# Patient Record
Sex: Female | Born: 1950 | Race: Black or African American | Hispanic: No | Marital: Single | Smoking: Never smoker
Health system: Southern US, Community
[De-identification: ages and names within clinical notes are randomized; demographics above are authoritative.]

## PROBLEM LIST (undated history)

## (undated) DIAGNOSIS — R569 Unspecified convulsions: Secondary | ICD-10-CM

## (undated) DIAGNOSIS — I10 Essential (primary) hypertension: Secondary | ICD-10-CM

## (undated) DIAGNOSIS — E119 Type 2 diabetes mellitus without complications: Secondary | ICD-10-CM

## (undated) DIAGNOSIS — G4730 Sleep apnea, unspecified: Secondary | ICD-10-CM

## (undated) DIAGNOSIS — N189 Chronic kidney disease, unspecified: Secondary | ICD-10-CM

## (undated) DIAGNOSIS — J984 Other disorders of lung: Secondary | ICD-10-CM

## (undated) HISTORY — PX: CARPAL TUNNEL RELEASE: SHX101

## (undated) HISTORY — PX: CHOLECYSTECTOMY: SHX55

## (undated) HISTORY — PX: DILATION AND CURETTAGE OF UTERUS: SHX78

## (undated) HISTORY — PX: TUBAL LIGATION: SHX77

## (undated) HISTORY — PX: SHOULDER SURGERY: SHX246

## (undated) HISTORY — DX: Type 2 diabetes mellitus without complications: E11.9

## (undated) HISTORY — DX: Essential (primary) hypertension: I10

## (undated) HISTORY — DX: Unspecified convulsions: R56.9

## (undated) HISTORY — DX: Other disorders of lung: J98.4

## (undated) HISTORY — PX: KNEE ARTHROSCOPY: SUR90

## (undated) HISTORY — PX: REVISION, CARPAL TUNNEL: SHX9640

## (undated) HISTORY — DX: Chronic kidney disease, unspecified: N18.9

## (undated) HISTORY — DX: Sleep apnea, unspecified: G47.30

---

## 2014-03-01 ENCOUNTER — Emergency Department (HOSPITAL_BASED_OUTPATIENT_CLINIC_OR_DEPARTMENT_OTHER): Payer: PRIVATE HEALTH INSURANCE

## 2014-03-01 ENCOUNTER — Inpatient Hospital Stay (HOSPITAL_BASED_OUTPATIENT_CLINIC_OR_DEPARTMENT_OTHER)
Admission: EM | Admit: 2014-03-01 | Discharge: 2014-03-03 | DRG: 101 | Disposition: A | Discharge status: Home / Self Care | Payer: PRIVATE HEALTH INSURANCE | Attending: Internal Medicine | Admitting: Internal Medicine

## 2014-03-01 ENCOUNTER — Encounter (HOSPITAL_BASED_OUTPATIENT_CLINIC_OR_DEPARTMENT_OTHER): Payer: Self-pay | Admitting: Emergency Medicine

## 2014-03-01 DIAGNOSIS — E119 Type 2 diabetes mellitus without complications: Secondary | ICD-10-CM

## 2014-03-01 DIAGNOSIS — Z794 Long term (current) use of insulin: Secondary | ICD-10-CM

## 2014-03-01 DIAGNOSIS — T426X5A Adverse effect of other antiepileptic and sedative-hypnotic drugs, initial encounter: Secondary | ICD-10-CM

## 2014-03-01 DIAGNOSIS — I10 Essential (primary) hypertension: Secondary | ICD-10-CM | POA: Diagnosis present

## 2014-03-01 DIAGNOSIS — R569 Unspecified convulsions: Secondary | ICD-10-CM

## 2014-03-01 DIAGNOSIS — T4275XA Adverse effect of unspecified antiepileptic and sedative-hypnotic drugs, initial encounter: Secondary | ICD-10-CM | POA: Diagnosis present

## 2014-03-01 DIAGNOSIS — G40109 Localization-related (focal) (partial) symptomatic epilepsy and epileptic syndromes with simple partial seizures, not intractable, without status epilepticus: Principal | ICD-10-CM | POA: Diagnosis present

## 2014-03-01 DIAGNOSIS — N390 Urinary tract infection, site not specified: Secondary | ICD-10-CM

## 2014-03-01 DIAGNOSIS — R9431 Abnormal electrocardiogram [ECG] [EKG]: Secondary | ICD-10-CM

## 2014-03-01 DIAGNOSIS — R931 Abnormal findings on diagnostic imaging of heart and coronary circulation: Secondary | ICD-10-CM

## 2014-03-01 DIAGNOSIS — I428 Other cardiomyopathies: Secondary | ICD-10-CM | POA: Diagnosis present

## 2014-03-01 HISTORY — DX: Type 2 diabetes mellitus without complications: E11.9

## 2014-03-01 HISTORY — DX: Essential (primary) hypertension: I10

## 2014-03-01 HISTORY — DX: Unspecified convulsions: R56.9

## 2014-03-01 LAB — COMPREHENSIVE METABOLIC PANEL
ALT: 19 U/L (ref 0–35)
ANION GAP: 16 — AB (ref 5–15)
AST: 16 U/L (ref 0–37)
Albumin: 3.4 g/dL — ABNORMAL LOW (ref 3.5–5.2)
Alkaline Phosphatase: 82 U/L (ref 39–117)
BUN: 16 mg/dL (ref 6–23)
CO2: 24 mEq/L (ref 19–32)
Calcium: 10.2 mg/dL (ref 8.4–10.5)
Chloride: 98 mEq/L (ref 96–112)
Creatinine, Ser: 1.1 mg/dL (ref 0.50–1.10)
GFR calc non Af Amer: 53 mL/min — ABNORMAL LOW (ref >90)
GFR, EST AFRICAN AMERICAN: 62 mL/min — AB (ref >90)
GLUCOSE: 273 mg/dL — AB (ref 70–99)
Potassium: 4.6 mEq/L (ref 3.7–5.3)
SODIUM: 138 meq/L (ref 137–147)
TOTAL PROTEIN: 8.1 g/dL (ref 6.0–8.3)
Total Bilirubin: <0.2 mg/dL — ABNORMAL LOW (ref 0.3–1.2)

## 2014-03-01 LAB — URINALYSIS, ROUTINE W REFLEX MICROSCOPIC
Bilirubin Urine: NEGATIVE
Glucose, UA: >1000 mg/dL — AB
HGB URINE DIPSTICK: NEGATIVE
Ketones, ur: 15 mg/dL — AB
NITRITE: NEGATIVE
Protein, ur: NEGATIVE mg/dL
SPECIFIC GRAVITY, URINE: 1.035 — AB (ref 1.005–1.030)
UROBILINOGEN UA: 0.2 mg/dL (ref 0.0–1.0)
pH: 6 (ref 5.0–8.0)

## 2014-03-01 LAB — URINE MICROSCOPIC-ADD ON

## 2014-03-01 LAB — CBC WITH DIFFERENTIAL/PLATELET
Basophils Absolute: 0 10*3/uL (ref 0.0–0.1)
Basophils Relative: 0 % (ref 0–1)
EOS ABS: 0.1 10*3/uL (ref 0.0–0.7)
EOS PCT: 1 % (ref 0–5)
HCT: 35.2 % — ABNORMAL LOW (ref 36.0–46.0)
Hemoglobin: 10.9 g/dL — ABNORMAL LOW (ref 12.0–15.0)
LYMPHS ABS: 4.1 10*3/uL — AB (ref 0.7–4.0)
Lymphocytes Relative: 49 % — ABNORMAL HIGH (ref 12–46)
MCH: 23.7 pg — ABNORMAL LOW (ref 26.0–34.0)
MCHC: 31 g/dL (ref 30.0–36.0)
MCV: 76.7 fL — AB (ref 78.0–100.0)
Monocytes Absolute: 0.6 10*3/uL (ref 0.1–1.0)
Monocytes Relative: 7 % (ref 3–12)
Neutro Abs: 3.6 10*3/uL (ref 1.7–7.7)
Neutrophils Relative %: 43 % (ref 43–77)
PLATELETS: 275 10*3/uL (ref 150–400)
RBC: 4.59 MIL/uL (ref 3.87–5.11)
RDW: 14.8 % (ref 11.5–15.5)
WBC: 8.4 10*3/uL (ref 4.0–10.5)

## 2014-03-01 LAB — VALPROIC ACID LEVEL: VALPROIC ACID LVL: 102.9 ug/mL — AB (ref 50.0–100.0)

## 2014-03-01 MED ORDER — DEXTROSE 5 % IV SOLN
1.0000 g | Freq: Once | INTRAVENOUS | Status: AC
  Administered 2014-03-01: 1 g via INTRAVENOUS

## 2014-03-01 MED ORDER — CEFTRIAXONE SODIUM 1 G IJ SOLR
INTRAMUSCULAR | Status: AC
  Filled 2014-03-01: qty 10

## 2014-03-01 MED ORDER — LEVETIRACETAM 500 MG/5ML IV SOLN
INTRAVENOUS | Status: AC
  Filled 2014-03-01: qty 10

## 2014-03-01 MED ORDER — SODIUM CHLORIDE 0.9 % IV SOLN
1000.0000 mg | Freq: Once | INTRAVENOUS | Status: AC
  Administered 2014-03-01: 1000 mg via INTRAVENOUS

## 2014-03-01 MED ORDER — SODIUM CHLORIDE 0.9 % IV BOLUS (SEPSIS)
1000.0000 mL | Freq: Once | INTRAVENOUS | Status: AC
  Administered 2014-03-01: 1000 mL via INTRAVENOUS

## 2014-03-01 NOTE — ED Notes (Addendum)
Pt c/o " seizures all day"  Seen at [facility] ED Friday for same

## 2014-03-01 NOTE — ED Notes (Signed)
Report to Paula RN

## 2014-03-01 NOTE — ED Provider Notes (Signed)
CSN: [id number]     Arrival date & time 03/01/14  1738 History  This chart was scribed for Sandra Canal, MD by Roxy Cedar, ED Scribe. This patient was seen in room MH05/MH05 and the patient's care was started at 6:27 PM.  Chief Complaint  Patient presents with  . Seizures   The history is provided by the patient. No language interpreter was used.    HPI Comments: Sandra Cobb is a 63 y.o. female who presents to the Emergency Department complaining of an episode of a grand mal seizure that occurred 2 nights ago and has been having moderate petit mal seizures all day today. She states that she has been experiencing feelings of "spaced out, staring into space, stuttering and her eyes going to the back of her neck" today. Patient states that she was seen at [facility] 2 days ago after episode of grand mal seizure. She states that she had recently ran out of her Depakote medication and missed doses for 2 days. She usually takes  of Depakote bi-daily. Patient states her last grand mal seizure occurred 8 years ago. She denies associated fever or vomiting. She is currently being seen by neurologist Dr. Zenda Alpers in [place].  Past Medical History  Diagnosis Date  . Seizures   . Diabetes mellitus without complication   . Hypertension    Past Surgical History  Procedure Laterality Date  . Dilation and curettage of uterus    . Cholecystectomy    . Carpal tunnel release    . Tubal ligation     No family history on file. History  Substance Use Topics  . Smoking status: Not on file  . Smokeless tobacco: Not on file  . Alcohol Use: Not on file   OB History   Grav Para Term Preterm Abortions TAB SAB Ect Mult Living                 Review of Systems  Constitutional: Negative for fever.  HENT: Positive for voice change (stuttering).   Gastrointestinal: Negative for vomiting.  Neurological: Positive for seizures.  All other systems reviewed and are  negative.   Allergies  Codeine  Home Medications   Prior to Admission medications   Medication Sig Start Date End Date Taking? Authorizing Provider  divalproex (DEPAKOTE ER) 500 MG 24 hr tablet Take 500 mg by mouth 2 (two) times daily.   Yes Historical Provider, MD  folic acid (FOLVITE) 800 MCG tablet Take 400 mcg by mouth daily.   Yes Historical Provider, MD  gabapentin (NEURONTIN) 100 MG capsule Take 100 mg by mouth at bedtime.   Yes Historical Provider, MD  insulin regular (NOVOLIN R,HUMULIN R) 100 units/mL injection Inject into the skin 2 (two) times daily before a meal.   Yes Historical Provider, MD  lisinopril (PRINIVIL,ZESTRIL) 10 MG tablet Take 10 mg by mouth daily.   Yes Historical Provider, MD  metFORMIN (GLUCOPHAGE) 500 MG tablet Take by mouth 2 (two) times daily with a meal.   Yes Historical Provider, MD   Triage Vitals: BP 149/75  Pulse 116  Temp(Src) 98.3 F (36.8 C) (Oral)  Resp 16  Ht  (1.549 m)  Wt 172 lb (78.019 kg)  BMI 32.52 kg/m2  SpO2 100%  Physical Exam  Nursing note and vitals reviewed. Constitutional: She is oriented to person, place, and time. She appears well-developed and well-nourished. No distress.  HENT:  Head: Normocephalic and atraumatic.  Mouth/Throat: Oropharynx is clear and moist.  Eyes: Conjunctivae and EOM are normal. Pupils are equal, round, and reactive to light.  Neck: Neck supple. No tracheal deviation present.  Cardiovascular: Regular rhythm and normal heart sounds.  Tachycardia present.   Pulmonary/Chest: Effort normal and breath sounds normal. No respiratory distress.  Musculoskeletal: Normal range of motion.  Neurological: She is alert and oriented to person, place, and time.  Normal finger to nose. Normal strength and sensation.  Skin: Skin is warm and dry.  Psychiatric: She has a normal mood and affect. Her behavior is normal.    ED Course  Procedures (including critical care time)  DIAGNOSTIC STUDIES: Oxygen  Saturation is 100% on RA, normal by my interpretation.    COORDINATION OF CARE: 6:35 PM- Discussed plans to order diagnostic imaging. Pt advised of plan for treatment and pt agrees.  Labs Review Labs Reviewed  CBC WITH DIFFERENTIAL - Abnormal; Notable for the following:    Hemoglobin 10.9 (*)    HCT 35.2 (*)    MCV 76.7 (*)    MCH 23.7 (*)    Lymphocytes Relative 49 (*)    Lymphs Abs 4.1 (*)    All other components within normal limits  COMPREHENSIVE METABOLIC PANEL - Abnormal; Notable for the following:    Glucose, Bld 273 (*)    Albumin 3.4 (*)    Total Bilirubin <0.2 (*)    GFR calc non Af Amer 53 (*)    GFR calc Af Amer 62 (*)    Anion gap 16 (*)    All other components within normal limits  URINALYSIS, ROUTINE W REFLEX MICROSCOPIC - Abnormal; Notable for the following:    APPearance CLOUDY (*)    Specific Gravity, Urine 1.035 (*)    Glucose, UA >1000 (*)    Ketones, ur 15 (*)    Leukocytes, UA MODERATE (*)    All other components within normal limits  VALPROIC ACID LEVEL - Abnormal; Notable for the following:    Valproic Acid Lvl 102.9 (*)    All other components within normal limits  URINE MICROSCOPIC-ADD ON - Abnormal; Notable for the following:    Squamous Epithelial / LPF FEW (*)    Bacteria, UA FEW (*)    All other components within normal limits    Imaging Review Ct Head Wo Contrast  03/01/2014   CLINICAL DATA:  SEIZURES SEIZURES  EXAM: CT HEAD WITHOUT CONTRAST  TECHNIQUE: Contiguous axial images were obtained from the base of the skull through the vertex without intravenous contrast.  COMPARISON:  None.  FINDINGS: Mild atrophy. Will There is no evidence of acute intracranial hemorrhage, brain edema, mass lesion, acute infarction, mass effect, or midline shift. Acute infarct may be inapparent on noncontrast CT. No other intra-axial abnormalities are seen, and the ventricles and sulci are within normal limits in size and symmetry. No abnormal extra-axial fluid  collections or masses are identified. No significant calvarial abnormality.  IMPRESSION: 1. Negative for bleed or other acute intracranial process.   Electronically Signed   By: Oley Balm M.D.   On: 03/01/2014 19:17     EKG Interpretation None      MDM   Final diagnoses:  None   Markasia Carrol is a 63 y.o. female hx seizure here with possible seizure, UTI. Labs showed depakote is supratherpeutic despite missing some doses. UA + UTI. I consulted neuro, Dr. Cyril Mourning, who recommend loading with keppra and observation. Given ceftriaxone for UTI. Will admit.   I personally performed the services described in this documentation,  which was scribed in my presence. The recorded information has been reviewed and is accurate.    Sandra Canal, MD 03/01/14 2124

## 2014-03-02 ENCOUNTER — Encounter (HOSPITAL_COMMUNITY): Payer: Self-pay | Admitting: Internal Medicine

## 2014-03-02 ENCOUNTER — Other Ambulatory Visit (HOSPITAL_COMMUNITY): Payer: Self-pay | Admitting: *Deleted

## 2014-03-02 ENCOUNTER — Ambulatory Visit (HOSPITAL_COMMUNITY): Payer: PRIVATE HEALTH INSURANCE

## 2014-03-02 DIAGNOSIS — G40109 Localization-related (focal) (partial) symptomatic epilepsy and epileptic syndromes with simple partial seizures, not intractable, without status epilepticus: Secondary | ICD-10-CM | POA: Diagnosis present

## 2014-03-02 DIAGNOSIS — E119 Type 2 diabetes mellitus without complications: Secondary | ICD-10-CM | POA: Diagnosis present

## 2014-03-02 DIAGNOSIS — N390 Urinary tract infection, site not specified: Secondary | ICD-10-CM | POA: Diagnosis present

## 2014-03-02 DIAGNOSIS — I519 Heart disease, unspecified: Secondary | ICD-10-CM

## 2014-03-02 DIAGNOSIS — Z794 Long term (current) use of insulin: Secondary | ICD-10-CM | POA: Diagnosis not present

## 2014-03-02 DIAGNOSIS — T426X5A Adverse effect of other antiepileptic and sedative-hypnotic drugs, initial encounter: Secondary | ICD-10-CM

## 2014-03-02 DIAGNOSIS — T4275XA Adverse effect of unspecified antiepileptic and sedative-hypnotic drugs, initial encounter: Secondary | ICD-10-CM | POA: Diagnosis present

## 2014-03-02 DIAGNOSIS — I10 Essential (primary) hypertension: Secondary | ICD-10-CM | POA: Diagnosis present

## 2014-03-02 DIAGNOSIS — I428 Other cardiomyopathies: Secondary | ICD-10-CM | POA: Diagnosis present

## 2014-03-02 DIAGNOSIS — R569 Unspecified convulsions: Secondary | ICD-10-CM

## 2014-03-02 DIAGNOSIS — R9431 Abnormal electrocardiogram [ECG] [EKG]: Secondary | ICD-10-CM | POA: Diagnosis present

## 2014-03-02 LAB — CBC WITH DIFFERENTIAL/PLATELET
BASOS ABS: 0 10*3/uL (ref 0.0–0.1)
Basophils Relative: 0 % (ref 0–1)
Eosinophils Absolute: 0.1 10*3/uL (ref 0.0–0.7)
Eosinophils Relative: 1 % (ref 0–5)
HCT: 31.2 % — ABNORMAL LOW (ref 36.0–46.0)
Hemoglobin: 9.5 g/dL — ABNORMAL LOW (ref 12.0–15.0)
Lymphocytes Relative: 51 % — ABNORMAL HIGH (ref 12–46)
Lymphs Abs: 3.9 10*3/uL (ref 0.7–4.0)
MCH: 23.8 pg — ABNORMAL LOW (ref 26.0–34.0)
MCHC: 30.4 g/dL (ref 30.0–36.0)
MCV: 78 fL (ref 78.0–100.0)
Monocytes Absolute: 0.5 10*3/uL (ref 0.1–1.0)
Monocytes Relative: 6 % (ref 3–12)
NEUTROS ABS: 3.2 10*3/uL (ref 1.7–7.7)
NEUTROS PCT: 42 % — AB (ref 43–77)
Platelets: 259 10*3/uL (ref 150–400)
RBC: 4 MIL/uL (ref 3.87–5.11)
RDW: 14.6 % (ref 11.5–15.5)
WBC: 7.7 10*3/uL (ref 4.0–10.5)

## 2014-03-02 LAB — TSH: TSH: 4.48 u[IU]/mL (ref 0.350–4.500)

## 2014-03-02 LAB — VALPROIC ACID LEVEL: Valproic Acid Lvl: 78.9 ug/mL (ref 50.0–100.0)

## 2014-03-02 LAB — COMPREHENSIVE METABOLIC PANEL
ALK PHOS: 73 U/L (ref 39–117)
ALT: 19 U/L (ref 0–35)
ANION GAP: 11 (ref 5–15)
AST: 14 U/L (ref 0–37)
Albumin: 2.7 g/dL — ABNORMAL LOW (ref 3.5–5.2)
BUN: 13 mg/dL (ref 6–23)
CHLORIDE: 101 meq/L (ref 96–112)
CO2: 26 mEq/L (ref 19–32)
CREATININE: 0.98 mg/dL (ref 0.50–1.10)
Calcium: 8.9 mg/dL (ref 8.4–10.5)
GFR calc non Af Amer: 61 mL/min — ABNORMAL LOW (ref >90)
GFR, EST AFRICAN AMERICAN: 71 mL/min — AB (ref >90)
Glucose, Bld: 245 mg/dL — ABNORMAL HIGH (ref 70–99)
POTASSIUM: 4.8 meq/L (ref 3.7–5.3)
Sodium: 138 mEq/L (ref 137–147)
Total Protein: 6.7 g/dL (ref 6.0–8.3)

## 2014-03-02 LAB — GLUCOSE, CAPILLARY
GLUCOSE-CAPILLARY: 194 mg/dL — AB (ref 70–99)
GLUCOSE-CAPILLARY: 147 mg/dL — AB (ref 70–99)
Glucose-Capillary: 187 mg/dL — ABNORMAL HIGH (ref 70–99)
Glucose-Capillary: 213 mg/dL — ABNORMAL HIGH (ref 70–99)

## 2014-03-02 LAB — TROPONIN I
Troponin I: <0.3 ng/mL (ref <0.30)
Troponin I: <0.3 ng/mL (ref <0.30)
Troponin I: <0.3 ng/mL (ref <0.30)

## 2014-03-02 LAB — RAPID URINE DRUG SCREEN, HOSP PERFORMED
Amphetamines: NOT DETECTED
Barbiturates: NOT DETECTED
Benzodiazepines: NOT DETECTED
COCAINE: NOT DETECTED
OPIATES: NOT DETECTED
Tetrahydrocannabinol: NOT DETECTED

## 2014-03-02 LAB — AMMONIA: Ammonia: 21 umol/L (ref 11–60)

## 2014-03-02 MED ORDER — INSULIN ASPART 100 UNIT/ML ~~LOC~~ SOLN
0.0000 [IU] | Freq: Three times a day (TID) | SUBCUTANEOUS | Status: DC
  Administered 2014-03-02: 1 [IU] via SUBCUTANEOUS
  Administered 2014-03-02 – 2014-03-03 (×3): 2 [IU] via SUBCUTANEOUS

## 2014-03-02 MED ORDER — FOLIC ACID 0.5 MG HALF TAB
500.0000 ug | ORAL_TABLET | Freq: Every day | ORAL | Status: DC
  Administered 2014-03-02 – 2014-03-03 (×2): 0.5 mg via ORAL
  Filled 2014-03-02 (×3): qty 1

## 2014-03-02 MED ORDER — LEVETIRACETAM 500 MG PO TABS: 500.0000 mg | ORAL_TABLET | Freq: Two times a day (BID) | ORAL | Status: DC

## 2014-03-02 MED ORDER — ONDANSETRON HCL 4 MG PO TABS: 4.0000 mg | ORAL_TABLET | Freq: Four times a day (QID) | ORAL | Status: DC | PRN

## 2014-03-02 MED ORDER — LISINOPRIL 10 MG PO TABS: 10.0000 mg | ORAL_TABLET | Freq: Every day | ORAL | Status: DC

## 2014-03-02 MED ORDER — DEXTROSE 5 % IV SOLN
1.0000 g | INTRAVENOUS | Status: DC
  Administered 2014-03-02: 1 g via INTRAVENOUS
  Filled 2014-03-02 (×2): qty 10

## 2014-03-02 MED ORDER — ASPIRIN 325 MG PO TABS
325.0000 mg | ORAL_TABLET | Freq: Every day | ORAL | Status: DC
  Administered 2014-03-02 – 2014-03-03 (×2): 325 mg via ORAL
  Filled 2014-03-02 (×3): qty 1

## 2014-03-02 MED ORDER — GABAPENTIN 100 MG PO CAPS
100.0000 mg | ORAL_CAPSULE | Freq: Every day | ORAL | Status: DC
  Administered 2014-03-02: 100 mg via ORAL
  Filled 2014-03-02: qty 1

## 2014-03-02 MED ORDER — ONDANSETRON HCL 4 MG/2ML IJ SOLN: 4.0000 mg | Freq: Four times a day (QID) | INTRAMUSCULAR | Status: DC | PRN

## 2014-03-02 MED ORDER — DIVALPROEX SODIUM ER 500 MG PO TB24
500.0000 mg | ORAL_TABLET | Freq: Two times a day (BID) | ORAL | Status: DC
  Administered 2014-03-02 – 2014-03-03 (×3): 500 mg via ORAL
  Filled 2014-03-02 (×6): qty 1

## 2014-03-02 MED ORDER — LEVETIRACETAM 500 MG PO TABS
500.0000 mg | ORAL_TABLET | Freq: Two times a day (BID) | ORAL | Status: DC
  Administered 2014-03-02 (×2): 500 mg via ORAL
  Filled 2014-03-02 (×2): qty 1

## 2014-03-02 MED ORDER — SODIUM CHLORIDE 0.9 % IV SOLN: INTRAVENOUS | Status: DC

## 2014-03-02 MED ORDER — SODIUM CHLORIDE 0.9 % IV SOLN
INTRAVENOUS | Status: AC
  Administered 2014-03-02 (×2): via INTRAVENOUS

## 2014-03-02 MED ORDER — ACETAMINOPHEN 650 MG RE SUPP: 650.0000 mg | Freq: Four times a day (QID) | RECTAL | Status: DC | PRN

## 2014-03-02 MED ORDER — ACETAMINOPHEN 325 MG PO TABS: 650.0000 mg | ORAL_TABLET | Freq: Four times a day (QID) | ORAL | Status: DC | PRN

## 2014-03-02 MED ORDER — ENOXAPARIN SODIUM 40 MG/0.4ML ~~LOC~~ SOLN
40.0000 mg | SUBCUTANEOUS | Status: DC
  Administered 2014-03-02: 40 mg via SUBCUTANEOUS
  Filled 2014-03-02: qty 0.4

## 2014-03-02 MED ORDER — LEVETIRACETAM IN NACL 1000 MG/100ML IV SOLN
1000.0000 mg | Freq: Two times a day (BID) | INTRAVENOUS | Status: DC
  Administered 2014-03-02: 1000 mg via INTRAVENOUS
  Filled 2014-03-02 (×2): qty 100

## 2014-03-02 NOTE — H&P (Signed)
[facility] History and Physical  Sandra Cobb JWJ:[id number] DOB: 01/03/1951 DOA: 03/01/2014  Referring physician: ER physician. Patient was transferred from the [facility] [place]. PCP: No PCP Per Patient patient recently relocated from [place].  Chief Complaint: Seizures.  HPI: Sandra Cobb is a 63 y.o. female with history of seizures diabetes mellitus and hypertension has had a recent grand mal seizure last Friday and had gone to [facility]. Patient had not been taking her Depakote for couple of days because she ran out of it. Patient was given IV Depakote and prescription. The following 2 days patient has been having increased staring spells typical of her Petit Mal seizures. Patient states that she gets one or 2 episodes every week but has been having at least 10 episodes every day last couple of days. In the ER patient was found to have supratherapeutic Depakote level. CT head did not show anything acute. Patient was given Keppra 1 g IV loading dose after discussing with neurologist. Patient has been admitted for further management. Patient denies any chest pain shortness of breath nausea vomiting abdominal pain or diarrhea. Has been having dysuria for last few days and urine shows features consistent with possible UTI.   Review of Systems: As presented in the history of presenting illness, rest negative.  Past Medical History  Diagnosis Date  . Seizures   . Diabetes mellitus without complication   . Hypertension    Past Surgical History  Procedure Laterality Date  . Dilation and curettage of uterus    . Cholecystectomy    . Carpal tunnel release    . Tubal ligation     Social History:  reports that she has never smoked. She does not have any smokeless tobacco history on file. She reports that she drinks alcohol. She reports that she does not use illicit drugs. Where does patient live home. Can patient participate in ADLs? Yes.  Allergies   Allergen Reactions  . Codeine     Family History:  Family History  Problem Relation Age of Onset  . Seizures Father   . Seizures Sister   . Breast cancer Maternal Aunt       Prior to Admission medications   Medication Sig Start Date End Date Taking? Authorizing Provider  divalproex (DEPAKOTE ER) 500 MG 24 hr tablet Take 500 mg by mouth 2 (two) times daily.   Yes Historical Provider, MD  folic acid (FOLVITE) 800 MCG tablet Take 400 mcg by mouth daily.   Yes Historical Provider, MD  gabapentin (NEURONTIN) 100 MG capsule Take 100 mg by mouth at bedtime.   Yes Historical Provider, MD  insulin regular (NOVOLIN R,HUMULIN R) 100 units/mL injection Inject into the skin 2 (two) times daily before a meal.   Yes Historical Provider, MD  lisinopril (PRINIVIL,ZESTRIL) 10 MG tablet Take 10 mg by mouth daily.   Yes Historical Provider, MD  metFORMIN (GLUCOPHAGE) 500 MG tablet Take by mouth 2 (two) times daily with a meal.   Yes Historical Provider, MD    Physical Exam: Filed Vitals:   03/01/14 2200 03/01/14 2230 03/01/14 2259 03/02/14 0015  BP: 120/58 123/49 115/61 127/65  Pulse: 101 104 109 106  Temp:   98.6 F (37 C) 98.4 F (36.9 C)  TempSrc:   Oral Oral  Resp: Height:     (1.549 m)  Weight:    79.425 kg (175 lb 1.6 oz)  SpO2: 99% 98% 97% 100%  General:  Well-developed and nourished.  Eyes: Anicteric no pallor.  ENT: No discharge from the ears eyes nose mouth.  Neck: No mass felt.  Cardiovascular: S1-S2 heard.  Respiratory: No rhonchi or crepitations.  Abdomen: Soft nontender bowel sounds present. No guarding or rigidity.  Skin: No rash.  Musculoskeletal: No edema.  Psychiatric: Appears normal.  Neurologic: Alert awake oriented to time place and person. Moves all extremities 5 x 5. No facial asymmetry.  Labs on Admission:  Basic Metabolic Panel:  Recent Labs Lab 03/01/14 1820  NA 138  K 4.6  CL 98  CO2 24  GLUCOSE 273*  BUN 16   CREATININE 1.10  CALCIUM 10.2   Liver Function Tests:  Recent Labs Lab 03/01/14 1820  AST 16  ALT 19  ALKPHOS 82  BILITOT <0.2*  PROT 8.1  ALBUMIN 3.4*   No results found for this basename: LIPASE, AMYLASE,  in the last 168 hours No results found for this basename: AMMONIA,  in the last 168 hours CBC:  Recent Labs Lab 03/01/14 1820  WBC 8.4  NEUTROABS 3.6  HGB 10.9*  HCT 35.2*  MCV 76.7*  PLT 275   Cardiac Enzymes: No results found for this basename: CKTOTAL, CKMB, CKMBINDEX, TROPONINI,  in the last 168 hours  BNP (last 3 results) No results found for this basename: PROBNP,  in the last 8760 hours CBG: No results found for this basename: GLUCAP,  in the last 168 hours  Radiological Exams on Admission: Ct Head Wo Contrast  03/01/2014   CLINICAL DATA:  SEIZURES SEIZURES  EXAM: CT HEAD WITHOUT CONTRAST  TECHNIQUE: Contiguous axial images were obtained from the base of the skull through the vertex without intravenous contrast.  COMPARISON:  None.  FINDINGS: Mild atrophy. Will There is no evidence of acute intracranial hemorrhage, brain edema, mass lesion, acute infarction, mass effect, or midline shift. Acute infarct may be inapparent on noncontrast CT. No other intra-axial abnormalities are seen, and the ventricles and sulci are within normal limits in size and symmetry. No abnormal extra-axial fluid collections or masses are identified. No significant calvarial abnormality.  IMPRESSION: 1. Negative for bleed or other acute intracranial process.   Electronically Signed   By: Oley Balm M.D.   On: 03/01/2014 19:17    EKG: Independently reviewed. Normal sinus rhythm with ST T changes in inferior leads and ST depression in the lateral leads. Patient has no chest pain.  Assessment/Plan Principal Problem:   Seizure Active Problems:   UTI (lower urinary tract infection)   Valproic acid causing adverse effect in therapeutic use   Diabetes mellitus type 2,  controlled   1. Seizure - have discussed with on-call neurologist Dr. Cyril Mourning. Dr. Cyril Mourning has advised patient to be continued on Keppra 500 mg twice a day and to recheck Depakote level and once levels are within therapeutic range to restart Depakote. Seizure precautions. EEG. Further recommendations per neurologist. 2. Supratherapeutic Depakote levels - see #1. Follow Depakote levels and LFTs and ammonia. 3. UTI - patient on ceftriaxone. Follow urine culture. Ceftriaxone. 4. Abnormal EKG - patient's EKG shows ST elevation in inferior leads with concave upwards. Patient also has ST depression in lateral leads. I have discussed with on-call cardiologist Dr. Jon Billings. Patient has no chest pain. At this time cardiologist has recommended to cycle troponins. I have also ordered 2-D echo. Aspirin. 5. Diabetes mellitus type 2 - hold metformin for now and place patient on sliding-scale coverage. Closely follow CBGs. 6. Hypertension - continue present  medications.    Code Status: Full code.  Family Communication: None.  Disposition Plan: Admit to inpatient.    Samiksha Pellicano N. [facility] Pager [phone].  If 7PM-7AM, please contact night-coverage [email or web address] Password [email or web address] 03/02/2014, 2:08 AM

## 2014-03-02 NOTE — Progress Notes (Signed)
[facility] PROGRESS NOTE  Sandra Cobb SWN:[id number] DOB: 1950/10/10 DOA: 03/01/2014 PCP: No PCP Per Patient  Assessment/Plan: 1-Seizure; partial with secondary generalization.  -Received Keppra IV load.  -Resume Depakote, valproic acid level decrease to 78 from 102. -started on Keppra.  -EEG pending.  -neurology following.   2-UTI:  -Continue with ceftriaxone day 2.  -follow urine culture.   3-Hypertension:  -Hold lisinopril, SBP in the 110 range.   4-Abnormal EKG; EKG shows ST elevation in inferior leads with concave upwards -First troponin negative. Chest pain free.  -Follow ECHO.  -Continue to cycle enzymes.   5-Diabetes mellitus type 2 - hold metformin while inpatient. sliding-scale coverage.   Code Status: Full Code.  Family Communication: care discussed with patient.  Disposition Plan: remain inpatient. Needs PCP.    Consultants:  neurology  Procedures:  ECHO; pending.   Antibiotics:  Ceftriaxone. 9-1  HPI/Subjective: Feeling better, no more episodes. She was having increase frequency for last few months. She just move to [place]. Does not have PCP.  Objective: Filed Vitals:   03/02/14 0600  BP: 110/59  Pulse: 99  Temp: 98.2 F (36.8 C)  Resp: 16    Intake/Output Summary (Last 24 hours) at 03/02/14 0755 Last data filed at 03/01/14 2151  Gross per 24 hour  Intake   1000 ml  Output      0 ml  Net   1000 ml   Filed Weights   03/01/14 1751 03/02/14 0015  Weight: 78.019 kg (172 lb) 79.425 kg (175 lb 1.6 oz)    Exam:   General: Alert in no distress.   Cardiovascular: S 1, S 2 RRR  Respiratory: CTA  Abdomen: BS present, soft, NT  Musculoskeletal: no edema,.  Neuro; non focal.   Data Reviewed: Basic Metabolic Panel:  Recent Labs Lab 03/01/14 1820 03/02/14 0457  NA 138 138  K 4.6 4.8  CL 98 101  CO2 24 26  GLUCOSE 273* 245*  BUN 16 13  CREATININE 1.10 0.98  CALCIUM 10.2 8.9   Liver Function Tests:  Recent  Labs Lab 03/01/14 1820 03/02/14 0457  AST 16 14  ALT 19 19  ALKPHOS 82 73  BILITOT <0.2* <0.2*  PROT 8.1 6.7  ALBUMIN 3.4* 2.7*   No results found for this basename: LIPASE, AMYLASE,  in the last 168 hours  Recent Labs Lab 03/02/14 0604  AMMONIA 21   CBC:  Recent Labs Lab 03/01/14 1820 03/02/14 0457  WBC 8.4 7.7  NEUTROABS 3.6 3.2  HGB 10.9* 9.5*  HCT 35.2* 31.2*  MCV 76.7* 78.0  PLT 275 259   Cardiac Enzymes:  Recent Labs Lab 03/02/14 0604  TROPONINI <0.30   BNP (last 3 results) No results found for this basename: PROBNP,  in the last 8760 hours CBG:  Recent Labs Lab 03/02/14 0745  GLUCAP 187*    No results found for this or any previous visit (from the past 240 hour(s)).   Studies: Ct Head Wo Contrast  03/01/2014   CLINICAL DATA:  SEIZURES SEIZURES  EXAM: CT HEAD WITHOUT CONTRAST  TECHNIQUE: Contiguous axial images were obtained from the base of the skull through the vertex without intravenous contrast.  COMPARISON:  None.  FINDINGS: Mild atrophy. Will There is no evidence of acute intracranial hemorrhage, brain edema, mass lesion, acute infarction, mass effect, or midline shift. Acute infarct may be inapparent on noncontrast CT. No other intra-axial abnormalities are seen, and the ventricles and sulci are within normal limits in size and  symmetry. No abnormal extra-axial fluid collections or masses are identified. No significant calvarial abnormality.  IMPRESSION: 1. Negative for bleed or other acute intracranial process.   Electronically Signed   By: Oley Balm M.D.   On: 03/01/2014 19:17    Scheduled Meds: . aspirin  325 mg Oral Daily  . cefTRIAXone (ROCEPHIN)  IV  1 g Intravenous Q24H  . divalproex  500 mg Oral BID  . enoxaparin (LOVENOX) injection  40 mg Subcutaneous Q24H  . folic acid  500 mcg Oral Daily  . gabapentin  100 mg Oral QHS  . insulin aspart  0-9 Units Subcutaneous TID WC  . levETIRAcetam  500 mg Oral BID  . lisinopril  10 mg Oral  Daily   Continuous Infusions: . sodium chloride 50 mL/hr at 03/02/14 0231    Principal Problem:   Seizure Active Problems:   UTI (lower urinary tract infection)   Valproic acid causing adverse effect in therapeutic use   Diabetes mellitus type 2, controlled    Time spent: 35 minutes.     [name]  [facility] Pager [phone]. If 7PM-7AM, please contact night-coverage at [email or web address], password [facility] 03/02/2014, 7:55 AM  LOS: 1 day

## 2014-03-02 NOTE — Progress Notes (Signed)
Echocardiogram 2D Echocardiogram has been performed.  Sandra Cobb 03/02/2014, 12:36 PM

## 2014-03-02 NOTE — Progress Notes (Signed)
Patient arrived to room via carelink.  Patient in stable condition upon arrival. VSS. Patient laying in bed and call light is within reach. Will continue to monitor patient closely. Monia Pouch, RN

## 2014-03-02 NOTE — Progress Notes (Signed)
CM CONSULT Talked to patient about follow up medical care; Patient from [place] to [place] , [place] 2 yrs ago and has been traveling back to [place] every 3 months to see her Neurologist Dr Zenda Alpers; Patient is agreeable to see a PCP in the [facility]; apt made at the [facility] for Sept 9, 2015 at 10 am; Pharmacy of choice is [facility] and patient stated that she does not have any problems getting her medicine; Independent of all ADL's prior to admission; Alexis Goodell [phone]

## 2014-03-02 NOTE — Progress Notes (Addendum)
Attempted but due to the patient having a glued on hair piece electrodes could not be applied. Pt expressed the desire to have the test done as an out patient. Dr Amada Jupiter made aware of the situation. EEG not done at this time.

## 2014-03-02 NOTE — Consult Note (Signed)
NEURO HOSPITALIST CONSULT NOTE    Reason for Consult: seizures  HPI:                                                                                                                                          Sandra Cobb is an 63 y.o. female with a past medical history significant for HTN, DM, and seizures since age 77 years old, transferred to [facility] due to daily " petit seizures" and patient's concern about having a GTC seizure. She tells me that her GTC seizure haven ben pretty well controlled throughout the years with Depakote (did not have grand mal seizures x 8 years until few days ago) which she takes faithfully but ran out Depakote x 2 days and had a GTC seizure this past Friday. Ever since, she has been having " petit seizures" that she describes as " staring into the space, spacing out or few seconds" and afterwards she is little bit confused but not always amnestic for the event. These " petit mal seizures" occur daily, several times a day. She said that she had had total of 6 grand mal seizures in life, none of them preceded by " a petit seizure".   She is allergic to Dilantin but never been treated with a new generation AED. Has UTI treated with antibiotics. Denies HA, vertigo, double vision, unsteadiness, focal weakness or numbness, slurred speech, language or vision impairment. No recent fever or chills. No alcohol intake, sleep deprivation, use of illicit drugs, narcotics, or benzodiazepines. VPA level supra-therapeutic 102.9. Loaded with 1 gram IV keppra in the ED.  Past Medical History  Diagnosis Date  . Seizures   . Diabetes mellitus without complication   . Hypertension     Past Surgical History  Procedure Laterality Date  . Dilation and curettage of uterus    . Cholecystectomy    . Carpal tunnel release    . Tubal ligation      No family history on file.   Social History:  has no tobacco, alcohol, and drug history on file.  Allergies   Allergen Reactions  . Codeine     MEDICATIONS:  Scheduled: . sodium chloride   Intravenous STAT     ROS:                                                                                                                                       History obtained from the patient  General ROS: negative for - chills, fatigue, fever, night sweats, weight gain or weight loss Psychological ROS: negative for - behavioral disorder, hallucinations, memory difficulties, mood swings or suicidal ideation Ophthalmic ROS: negative for - blurry vision, double vision, eye pain or loss of vision ENT ROS: negative for - epistaxis, nasal discharge, oral lesions, sore throat, tinnitus or vertigo Allergy and Immunology ROS: negative for - hives or itchy/watery eyes Hematological and Lymphatic ROS: negative for - bleeding problems, bruising or swollen lymph nodes Endocrine ROS: negative for - galactorrhea, hair pattern changes, polydipsia/polyuria or temperature intolerance Respiratory ROS: negative for - cough, hemoptysis, shortness of breath or wheezing Cardiovascular ROS: negative for - chest pain, dyspnea on exertion, edema or irregular heartbeat Gastrointestinal ROS: negative for - abdominal pain, diarrhea, hematemesis, nausea/vomiting or stool incontinence Genito-Urinary ROS: negative for - dysuria, hematuria, incontinence or urinary frequency/urgency Musculoskeletal ROS: negative for - joint swelling or muscular weakness Neurological ROS: as noted in HPI Dermatological ROS: negative for rash and skin lesion changes  Physical exam: pleasant female in no apparent distress. Blood pressure 127/65, pulse 106, temperature 98.4 F (36.9 C), temperature source Oral, resp. rate 18, height 5' 1"  (1.549 m), weight 79.425 kg (175 lb 1.6 oz), SpO2 100.00%. Head: normocephalic. Neck: supple, no bruits, no  JVD. Cardiac: no murmurs. Lungs: clear. Abdomen: soft, no tender, no mass. Extremities: no edema. Neurologic Examination:                                                                                                      General: Mental Status: Alert, oriented, thought content appropriate.  Speech fluent without evidence of aphasia.  Able to follow 3 step commands without difficulty. Cranial Nerves: II: Discs flat bilaterally; Visual fields grossly normal, pupils equal, round, reactive to light and accommodation III,IV, VI: ptosis not present, extra-ocular motions intact bilaterally V,VII: smile symmetric, facial light touch sensation normal bilaterally VIII: hearing normal bilaterally IX,X: gag reflex present XI: bilateral shoulder shrug XII: midline tongue extension without atrophy or fasciculations  Motor: Right : Upper extremity   5/5    Left:     Upper extremity   5/5  Lower extremity   5/5  Lower extremity   5/5 Tone and bulk:normal tone throughout; no atrophy noted Sensory: Pinprick and light touch intact throughout, bilaterally Deep Tendon Reflexes:  Right: Upper Extremity   Left: Upper extremity   biceps (C-5 to C-6) 2/4   biceps (C-5 to C-6) 2/4 tricep (C7) 2/4    triceps (C7) 2/4 Brachioradialis (C6) 2/4  Brachioradialis (C6) 2/4  Lower Extremity Lower Extremity  quadriceps (L-2 to L-4) 2/4   quadriceps (L-2 to L-4) 2/4 Achilles (S1) 2/4   Achilles (S1) 2/4  Plantars: Right: downgoing   Left: downgoing Cerebellar: normal finger-to-nose,  normal heel-to-shin test Gait:  No tested.  CV: pulses palpable throughout    No results found for this basename: cbc, bmp, coags, chol, tri, ldl, hga1c    Results for orders placed during the hospital encounter of 03/01/14 (from the past 48 hour(s))  CBC WITH DIFFERENTIAL     Status: Abnormal   Collection Time    03/01/14  6:20 PM      Result Value Ref Range   WBC 8.4  4.0 - 10.5 K/uL   RBC 4.59  3.87 - 5.11 MIL/uL    Hemoglobin 10.9 (*) 12.0 - 15.0 g/dL   HCT 35.2 (*) 36.0 - 46.0 %   MCV 76.7 (*) 78.0 - 100.0 fL   MCH 23.7 (*) 26.0 - 34.0 pg   MCHC 31.0  30.0 - 36.0 g/dL   RDW 14.8  11.5 - 15.5 %   Platelets 275  150 - 400 K/uL   Neutrophils Relative % 43  43 - 77 %   Neutro Abs 3.6  1.7 - 7.7 K/uL   Lymphocytes Relative 49 (*) 12 - 46 %   Lymphs Abs 4.1 (*) 0.7 - 4.0 K/uL   Monocytes Relative 7  3 - 12 %   Monocytes Absolute 0.6  0.1 - 1.0 K/uL   Eosinophils Relative 1  0 - 5 %   Eosinophils Absolute 0.1  0.0 - 0.7 K/uL   Basophils Relative 0  0 - 1 %   Basophils Absolute 0.0  0.0 - 0.1 K/uL  COMPREHENSIVE METABOLIC PANEL     Status: Abnormal   Collection Time    03/01/14  6:20 PM      Result Value Ref Range   Sodium 138  137 - 147 mEq/L   Potassium 4.6  3.7 - 5.3 mEq/L   Chloride 98  96 - 112 mEq/L   CO2 24  19 - 32 mEq/L   Glucose, Bld 273 (*) 70 - 99 mg/dL   BUN 16  6 - 23 mg/dL   Creatinine, Ser 1.10  0.50 - 1.10 mg/dL   Calcium 10.2  8.4 - 10.5 mg/dL   Total Protein 8.1  6.0 - 8.3 g/dL   Albumin 3.4 (*) 3.5 - 5.2 g/dL   AST 16  0 - 37 U/L   ALT 19  0 - 35 U/L   Alkaline Phosphatase 82  39 - 117 U/L   Total Bilirubin <0.2 (*) 0.3 - 1.2 mg/dL   GFR calc non Af Amer 53 (*) >90 mL/min   GFR calc Af Amer 62 (*) >90 mL/min   Comment: (NOTE)     The eGFR has been calculated using the CKD EPI equation.     This calculation has not been validated in all clinical situations.     eGFR's persistently <90 mL/min signify possible Chronic Kidney     Disease.   Anion gap 16 (*) 5 - 15  URINALYSIS, ROUTINE W REFLEX MICROSCOPIC     Status: Abnormal   Collection Time    03/01/14  6:20 PM      Result Value Ref Range   Color, Urine YELLOW  YELLOW   APPearance CLOUDY (*) CLEAR   Specific Gravity, Urine 1.035 (*) 1.005 - 1.030   pH 6.0  5.0 - 8.0   Glucose, UA >1000 (*) NEGATIVE mg/dL   Hgb urine dipstick NEGATIVE  NEGATIVE   Bilirubin Urine NEGATIVE  NEGATIVE   Ketones, ur 15 (*) NEGATIVE  mg/dL   Protein, ur NEGATIVE  NEGATIVE mg/dL   Urobilinogen, UA 0.2  0.0 - 1.0 mg/dL   Nitrite NEGATIVE  NEGATIVE   Leukocytes, UA MODERATE (*) NEGATIVE  URINE MICROSCOPIC-ADD ON     Status: Abnormal   Collection Time    03/01/14  6:20 PM      Result Value Ref Range   Squamous Epithelial / LPF FEW (*) RARE   WBC, UA 11-20  <3 WBC/hpf   RBC / HPF 3-6  <3 RBC/hpf   Bacteria, UA FEW (*) RARE   Urine-Other MUCOUS PRESENT     Comment: TRICHOMONAS PRESENT     FEW YEAST  VALPROIC ACID LEVEL     Status: Abnormal   Collection Time    03/01/14  7:12 PM      Result Value Ref Range   Valproic Acid Lvl 102.9 (*) 50.0 - 100.0 ug/mL    Ct Head Wo Contrast  03/01/2014   CLINICAL DATA:  SEIZURES SEIZURES  EXAM: CT HEAD WITHOUT CONTRAST  TECHNIQUE: Contiguous axial images were obtained from the base of the skull through the vertex without intravenous contrast.  COMPARISON:  None.  FINDINGS: Mild atrophy. Will There is no evidence of acute intracranial hemorrhage, brain edema, mass lesion, acute infarction, mass effect, or midline shift. Acute infarct may be inapparent on noncontrast CT. No other intra-axial abnormalities are seen, and the ventricles and sulci are within normal limits in size and symmetry. No abnormal extra-axial fluid collections or masses are identified. No significant calvarial abnormality.  IMPRESSION: 1. Negative for bleed or other acute intracranial process.   Electronically Signed   By: Arne Cleveland M.D.   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 Non focal neuro-exam. Supra-therapeutic VPA level. Mental status intact, but complains of daily, recurrent " petit mal seizures". Recommend: 1) Hold Depakote and check VPA level. Can resume when VPA therapeutic 2)Continue keppra 500 mg po BID for now. Will need to decide if dual AED is required.  3) EEG. 4) Will follow up.     Dorian Pod, MD 03/02/2014, 1:50 AM

## 2014-03-03 DIAGNOSIS — R9389 Abnormal findings on diagnostic imaging of other specified body structures: Secondary | ICD-10-CM

## 2014-03-03 DIAGNOSIS — R9431 Abnormal electrocardiogram [ECG] [EKG]: Secondary | ICD-10-CM

## 2014-03-03 LAB — GLUCOSE, CAPILLARY
Glucose-Capillary: 173 mg/dL — ABNORMAL HIGH (ref 70–99)
Glucose-Capillary: 153 mg/dL — ABNORMAL HIGH (ref 70–99)

## 2014-03-03 LAB — URINE CULTURE: Colony Count: 65000

## 2014-03-03 MED ORDER — ATORVASTATIN CALCIUM 20 MG PO TABS: 20.0000 mg | ORAL_TABLET | Freq: Every day | ORAL | Status: AC

## 2014-03-03 MED ORDER — METFORMIN HCL 500 MG PO TABS: 500.0000 mg | ORAL_TABLET | Freq: Two times a day (BID) | ORAL | Status: AC

## 2014-03-03 MED ORDER — ATORVASTATIN CALCIUM 10 MG PO TABS: 20.0000 mg | ORAL_TABLET | Freq: Every day | ORAL | Status: DC

## 2014-03-03 MED ORDER — GABAPENTIN 100 MG PO CAPS: 100.0000 mg | ORAL_CAPSULE | Freq: Every day | ORAL | Status: AC

## 2014-03-03 MED ORDER — DIVALPROEX SODIUM ER 500 MG PO TB24: 500.0000 mg | ORAL_TABLET | Freq: Two times a day (BID) | ORAL | Status: DC

## 2014-03-03 MED ORDER — ASPIRIN 81 MG PO CHEW
81.0000 mg | CHEWABLE_TABLET | Freq: Every day | ORAL | Status: DC
  Administered 2014-03-03: 81 mg via ORAL
  Filled 2014-03-03: qty 1

## 2014-03-03 MED ORDER — LEVETIRACETAM 1000 MG PO TABS: 1000.0000 mg | ORAL_TABLET | Freq: Two times a day (BID) | ORAL | Status: AC

## 2014-03-03 MED ORDER — DIVALPROEX SODIUM ER 500 MG PO TB24: 500.0000 mg | ORAL_TABLET | Freq: Two times a day (BID) | ORAL | Status: AC

## 2014-03-03 MED ORDER — CEPHALEXIN 500 MG PO CAPS: 500.0000 mg | ORAL_CAPSULE | Freq: Two times a day (BID) | ORAL | Status: AC

## 2014-03-03 MED ORDER — LEVETIRACETAM 500 MG PO TABS
1000.0000 mg | ORAL_TABLET | Freq: Two times a day (BID) | ORAL | Status: DC
  Administered 2014-03-03: 1000 mg via ORAL
  Filled 2014-03-03: qty 2

## 2014-03-03 MED ORDER — ASPIRIN 81 MG PO CHEW: 81.0000 mg | CHEWABLE_TABLET | Freq: Every day | ORAL | Status: AC

## 2014-03-03 MED ORDER — INSULIN REGULAR HUMAN 100 UNIT/ML IJ SOLN: 15.0000 [IU] | Freq: Two times a day (BID) | INTRAMUSCULAR | Status: AC

## 2014-03-03 NOTE — Discharge Summary (Signed)
Physician Discharge Summary  Sandra Cobb ZOX:[id number] DOB: 01/18/1951 DOA: 03/01/2014  PCP: No PCP Per Patient  Admit date: 03/01/2014 Discharge date: 03/03/2014  Time spent: 35 minutes  Recommendations for Outpatient Follow-up:  1. Needs EEG out patient.  2. Needs to follow up with cardiologist for stress test.  3. Further titration of insulin regimen.  4. Follow up final result of urine culture.    Discharge Diagnoses:    Seizure   UTI (lower urinary tract infection)   Valproic acid causing adverse effect in therapeutic use   Diabetes mellitus type 2, controlled   Abnormal EKG, mildly decrease EF.   Discharge Condition: stable.   Diet recommendation: Carb modified.   Filed Weights   03/01/14 1751 03/02/14 0015  Weight: 78.019 kg (172 lb) 79.425 kg (175 lb 1.6 oz)    History of present illness:  Sandra Cobb is a 63 y.o. female with history of seizures diabetes mellitus and hypertension has had a recent grand mal seizure last Friday and had gone to [facility]. Patient had not been taking her Depakote for couple of days because she ran out of it. Patient was given IV Depakote and prescription. The following 2 days patient has been having increased staring spells typical of her Petit Mal seizures. Patient states that she gets one or 2 episodes every week but has been having at least 10 episodes every day last couple of days. In the ER patient was found to have supratherapeutic Depakote level. CT head did not show anything acute. Patient was given Keppra 1 g IV loading dose after discussing with neurologist. Patient has been admitted for further management. Patient denies any chest pain shortness of breath nausea vomiting abdominal pain or diarrhea. Has been having dysuria for last few days and urine shows features consistent with possible UTI.    Hospital Course:  1-Seizure; partial with secondary generalization.  -Received Keppra IV load.  -Resume  Depakote, valproic acid level decrease to 78 from 102.  -She will be discharge on Keppra 1000 mg BID.  -Unable to perform EEG due to patient having a glued on hair. Plan to do EEG outpatient.  -no further seizure during this admission.   2-UTI:  -Continue with ceftriaxone day 3.  -follow urine culture.  -She will be discharge om Keflex for 3 more days.   3-Hypertension:  -will resume lisinopril.   4-Abnormal EKG, mild cardiomyopathy; EKG shows ST elevation in inferior leads with concave upwards  -Chest pain free.  -ECHO with mildly decrease EF 45 to 50 %  -Troponin negatives.  -Cardiology consulted for further evaluation.  -Cardiology recommend outpatient stress test.  -started on statin and aspirin. On ACE.   5-Diabetes mellitus type 2 - resume metformin and lower home dose insulin regimen.    Procedures: ECHO; Left ventricle: The cavity size was normal. Wall thickness was normal. Systolic function was mildly reduced. The estimated ejection fraction was in the range of 45% to 50%. Wall motion was normal; there were no regional wall motion abnormalities. Doppler parameters are consistent with abnormal left ventricular relaxation (grade 1 diastolic dysfunction).   Consultations:  Neurologist  Cardiologist  Discharge Exam: Filed Vitals:   03/03/14 1000  BP: 130/67  Pulse: 92  Temp: 98 F (36.7 C)  Resp: 18    General: No acute distress.  Cardiovascular: S 1, S 2 RRR Respiratory: CTA  Discharge Instructions You were cared for by a hospitalist during your hospital stay. If you have any questions  about your discharge medications or the care you received while you were in the hospital after you are discharged, you can call the unit and asked to speak with the hospitalist on call if the hospitalist that took care of you is not available. Once you are discharged, your primary care physician will handle any further medical issues. Please note that NO REFILLS for any  discharge medications will be authorized once you are discharged, as it is imperative that you return to your primary care physician (or establish a relationship with a primary care physician if you do not have one) for your aftercare needs so that they can reassess your need for medications and monitor your lab values.  Discharge Instructions   Diet Carb Modified    Complete by:  As directed      Increase activity slowly    Complete by:  As directed           Current Discharge Medication List    START taking these medications   Details  aspirin 81 MG chewable tablet Chew 1 tablet (81 mg total) by mouth daily. Qty: 30 tablet, Refills: 0    atorvastatin (LIPITOR) 20 MG tablet Take 1 tablet (20 mg total) by mouth daily at 6 PM. Qty: 30 tablet, Refills: 0    levETIRAcetam (KEPPRA) 1000 MG tablet Take 1 tablet (1,000 mg total) by mouth 2 (two) times daily. Qty: 60 tablet, Refills: 0      CONTINUE these medications which have CHANGED   Details  divalproex (DEPAKOTE ER) 500 MG 24 hr tablet Take 1 tablet (500 mg total) by mouth 2 (two) times daily. Qty: 60 tablet, Refills: 0    gabapentin (NEURONTIN) 100 MG capsule Take 1 capsule (100 mg total) by mouth at bedtime. Qty: 30 capsule, Refills: 0    insulin regular (NOVOLIN R,HUMULIN R) 100 units/mL injection Inject 0.15 mLs (15 Units total) into the skin 2 (two) times daily before a meal. Qty: 10 mL, Refills: 11    metFORMIN (GLUCOPHAGE) 500 MG tablet Take 1 tablet (500 mg total) by mouth 2 (two) times daily with a meal. Qty: 60 tablet, Refills: 0      CONTINUE these medications which have NOT CHANGED   Details  Ascorbic Acid (VITAMIN C PO) Take 1 tablet by mouth daily.    Cyanocobalamin (VITAMIN B 12 PO) Take 1 tablet by mouth daily.    folic acid (FOLVITE) 800 MCG tablet Take 400 mcg by mouth daily.    lisinopril (PRINIVIL,ZESTRIL) 10 MG tablet Take 10 mg by mouth at bedtime.     Multiple Vitamin (MULTIVITAMIN WITH MINERALS)  TABS tablet Take 1 tablet by mouth daily.    Omega-3 Fatty Acids (FISH OIL PO) Take 1 capsule by mouth daily.       Allergies  Allergen Reactions  . Codeine Rash  . Dilantin [Phenytoin Sodium Extended] Rash   Follow-up Information   Follow up with [facility] On 03/09/2014. (apt for 10 am. Please try to keep your apt or call to reschedule)    Contact information:   [address] [place] [address] [phone]        The results of significant diagnostics from this hospitalization (including imaging, microbiology, ancillary and laboratory) are listed below for reference.    Significant Diagnostic Studies: Ct Head Wo Contrast  03/01/2014   CLINICAL DATA:  SEIZURES SEIZURES  EXAM: CT HEAD WITHOUT CONTRAST  TECHNIQUE: Contiguous axial images were obtained  from the base of the skull through the vertex without intravenous contrast.  COMPARISON:  None.  FINDINGS: Mild atrophy. Will There is no evidence of acute intracranial hemorrhage, brain edema, mass lesion, acute infarction, mass effect, or midline shift. Acute infarct may be inapparent on noncontrast CT. No other intra-axial abnormalities are seen, and the ventricles and sulci are within normal limits in size and symmetry. No abnormal extra-axial fluid collections or masses are identified. No significant calvarial abnormality.  IMPRESSION: 1. Negative for bleed or other acute intracranial process.   Electronically Signed   By: Oley Balm M.D.   On: 03/01/2014 19:17    Microbiology: No results found for this or any previous visit (from the past 240 hour(s)).   Labs: Basic Metabolic Panel:  Recent Labs Lab 03/01/14 1820 03/02/14 0457  NA 138 138  K 4.6 4.8  CL 98 101  CO2 24 26  GLUCOSE 273* 245*  BUN 16 13  CREATININE 1.10 0.98  CALCIUM 10.2 8.9   Liver Function Tests:  Recent Labs Lab 03/01/14 1820 03/02/14 0457  AST 16 14  ALT 19 19  ALKPHOS 82 73  BILITOT <0.2* <0.2*   PROT 8.1 6.7  ALBUMIN 3.4* 2.7*   No results found for this basename: LIPASE, AMYLASE,  in the last 168 hours  Recent Labs Lab 03/02/14 0604  AMMONIA 21   CBC:  Recent Labs Lab 03/01/14 1820 03/02/14 0457  WBC 8.4 7.7  NEUTROABS 3.6 3.2  HGB 10.9* 9.5*  HCT 35.2* 31.2*  MCV 76.7* 78.0  PLT 275 259   Cardiac Enzymes:  Recent Labs Lab 03/02/14 0604 03/02/14 1050 03/02/14 1703  TROPONINI <0.30 <0.30 <0.30   BNP: BNP (last 3 results) No results found for this basename: PROBNP,  in the last 8760 hours CBG:  Recent Labs Lab 03/02/14 1205 03/02/14 1652 03/02/14 2112 03/03/14 0703 03/03/14 1126  GLUCAP 194* 147* 213* 173* 153*       Signed:  Regalado, Belkys A  [facility] 03/03/2014, 12:17 PM

## 2014-03-03 NOTE — Consult Note (Signed)
CARDIOLOGY CONSULT NOTE  Patient ID: Tacori Willhoite MRN: [id number] DOB/AGE: [birth year] 63 y.o.  Admit date: 03/01/2014 Primary Cardiologist: New Reason for Consultation: Abnormal ECG  HPI: 63 yo with history of HTN, type II diabetes, and a seizure disorder was admitted to control her seizures.  She had run out of her Depakote and had a grand mal seizure about a week ago.  She was seen in the hospital in [facility] and restarted on Depakote.  However, for about 2 days prior to admission yesterday, she was having about 10 of her typical petit mal seizures a day (staring episodes).  She was admitted yesterday for seizure management and is getting ongoing workup by neurology.   On admission, she was noted to have an abnormal ECG with concave up ST elevation in inferior leads and LVH with repolarization abnormality.  She had negative troponin x 3.  Echo was done.  I reviewed her echo. EF appears to be in the 50-55% range (low normal to mildly reduced) with possibly some inferior hypokinesis.    Patient has had no known cardiac problems. She had a negative stress test "years ago."  She does not smoke.  She takes ASA 81 at home but not a statin.  No exertional chest pain or pressure, no exertional dyspnea.  Exercise tolerance seems normal.  No lightheadedness or syncope.    Review of systems complete and found to be negative unless listed above in HPI  Past Medical History: 1. HTN 2. Type II diabetes 3. Hyperlipidemia: Not treated 4. Seizure disorder: Since age 8.  5. H/o cholecystectomy  Family History  Problem Relation Age of Onset  . Seizures Father   . Seizures Sister   . Breast cancer Maternal Aunt     History   Social History  . Marital Status: Single    Spouse Name: N/A    Number of Children: N/A  . Years of Education: N/A   Occupational History  . Not on file.   Social History Main Topics  . Smoking status: Never Smoker   . Smokeless tobacco: Not on file  . Alcohol  Use: Yes     Comment: wine ocasional.  . Drug Use: No  . Sexual Activity: Not on file   Other Topics Concern  . Not on file   Social History Narrative  . No narrative on file     Prescriptions prior to admission  Medication Sig Dispense Refill  . Ascorbic Acid (VITAMIN C PO) Take 1 tablet by mouth daily.      . Cyanocobalamin (VITAMIN B 12 PO) Take 1 tablet by mouth daily.      . divalproex (DEPAKOTE ER) 500 MG 24 hr tablet Take 500 mg by mouth 2 (two) times daily.      . folic acid (FOLVITE) 800 MCG tablet Take 400 mcg by mouth daily.      Marland Kitchen gabapentin (NEURONTIN) 100 MG capsule Take 100 mg by mouth at bedtime.      . insulin regular (NOVOLIN R,HUMULIN R) 100 units/mL injection Inject 40 Units into the skin 2 (two) times daily before a meal.       . lisinopril (PRINIVIL,ZESTRIL) 10 MG tablet Take 10 mg by mouth at bedtime.       . metFORMIN (GLUCOPHAGE) 500 MG tablet Take 500 mg by mouth 2 (two) times daily with a meal.       . Multiple Vitamin (MULTIVITAMIN WITH MINERALS) TABS tablet Take 1 tablet by mouth daily.      Marland Kitchen  Omega-3 Fatty Acids (FISH OIL PO) Take 1 capsule by mouth daily.        Physical exam Blood pressure 130/67, pulse 92, temperature 98 F (36.7 C), temperature source Oral, resp. rate 18, height  (1.549 m), weight 175 lb 1.6 oz (79.425 kg), SpO2 100.00%. General: NAD Neck: No JVD, no thyromegaly or thyroid nodule.  Lungs: Clear to auscultation bilaterally with normal respiratory effort. CV: Nondisplaced PMI.  Heart regular S1/S2, soft S4, no murmur.  No peripheral edema.  No carotid bruit.  Normal pedal pulses.  Abdomen: Soft, nontender, no hepatosplenomegaly, no distention.  Skin: Intact without lesions or rashes.  Neurologic: Alert and oriented x 3.  Psych: Normal affect. Extremities: No clubbing or cyanosis.  HEENT: Normal.   Labs:   Lab Results  Component Value Date   WBC 7.7 03/02/2014   HGB 9.5* 03/02/2014   HCT 31.2* 03/02/2014   MCV 78.0 03/02/2014    PLT 259 03/02/2014    Recent Labs Lab 03/02/14 0457  NA 138  K 4.8  CL 101  CO2 26  BUN 13  CREATININE 0.98  CALCIUM 8.9  PROT 6.7  BILITOT <0.2*  ALKPHOS 73  ALT 19  AST 14  GLUCOSE 245*   Lab Results  Component Value Date   TROPONINI <0.30 03/02/2014    EKG: NSR, LVH with repolarization abnormality (slight lateral ST depression), 1 mm concave up ST elevation in III and AVF  ASSESSMENT AND PLAN: 63 yo with history of HTN, type II diabetes, and a seizure disorder was admitted to control her seizures.  Cardiology asked to evaluate because of abnormal ECG and echo. Patient's echo shows low normal to mildly reduced systolic function, EF 50-55% with possible mild inferior hypokinesis. ECG shows LVH with repolarization, probably related to HTN.  There is concave up STE in the inferior leads. She has no cardiac symptoms (chest pain, exertional dyspnea, etc).    - Given the echo abnormality and her risk factors, I do think that she merits a stress test.  Will arrange ETT-Cardiolite, can be done as outpatient.  - Needs to continue ASA 81 daily, will add statin given h/o hyperlipidemia and diabetes.   Signed: Marca Ancona 03/03/2014 11:29 AM

## 2014-03-03 NOTE — Progress Notes (Signed)
[facility] PROGRESS NOTE  Sandra Cobb JIZ:[id number] DOB: Nov 09, 1950 DOA: 03/01/2014 PCP: No PCP Per Patient  Assessment/Plan: 1-Seizure; partial with secondary generalization.  -Received Keppra IV load.  -Resume Depakote, valproic acid level decrease to 78 from 102. -Change Keppra to oral.  -Unable to perform EEG due to patient having a glued on hair. Plan to do EEG outpatient.  -neurology following.   2-UTI:  -Continue with ceftriaxone day 3.  -follow urine culture.   3-Hypertension:  -Hold lisinopril, SBP in the 110 range.   4-Abnormal EKG; EKG shows ST elevation in inferior leads with concave upwards -Chest pain free.  -ECHO with mildly decrease EF 45 to 50 % -Troponin negatives.  -Cardiology consulted for further evaluation. Patient made NPO.   5-Diabetes mellitus type 2 - hold metformin while inpatient. sliding-scale coverage.   Code Status: Full Code.  Family Communication: care discussed with patient.  Disposition Plan: home depending on cardiology evaluation.    Consultants:  neurology  Procedures:  ECHO; pending.   Antibiotics:  Ceftriaxone. 9-1  HPI/Subjective: No more seizures, feeling well. Denies chest pain or dyspnea.   Objective: Filed Vitals:   03/03/14 0700  BP: 120/57  Pulse: 90  Temp: 97.9 F (36.6 C)  Resp: 18    Intake/Output Summary (Last 24 hours) at 03/03/14 0814 Last data filed at 03/02/14 1814  Gross per 24 hour  Intake    720 ml  Output      0 ml  Net    720 ml   Filed Weights   03/01/14 1751 03/02/14 0015  Weight: 78.019 kg (172 lb) 79.425 kg (175 lb 1.6 oz)    Exam:   General: Alert in no distress.   Cardiovascular: S 1, S 2 RRR  Respiratory: CTA  Abdomen: BS present, soft, NT  Musculoskeletal: no edema,.  Neuro; non focal.   Data Reviewed: Basic Metabolic Panel:  Recent Labs Lab 03/01/14 1820 03/02/14 0457  NA 138 138  K 4.6 4.8  CL 98 101  CO2 24 26  GLUCOSE 273* 245*  BUN 16 13   CREATININE 1.10 0.98  CALCIUM 10.2 8.9   Liver Function Tests:  Recent Labs Lab 03/01/14 1820 03/02/14 0457  AST 16 14  ALT 19 19  ALKPHOS 82 73  BILITOT <0.2* <0.2*  PROT 8.1 6.7  ALBUMIN 3.4* 2.7*   No results found for this basename: LIPASE, AMYLASE,  in the last 168 hours  Recent Labs Lab 03/02/14 0604  AMMONIA 21   CBC:  Recent Labs Lab 03/01/14 1820 03/02/14 0457  WBC 8.4 7.7  NEUTROABS 3.6 3.2  HGB 10.9* 9.5*  HCT 35.2* 31.2*  MCV 76.7* 78.0  PLT 275 259   Cardiac Enzymes:  Recent Labs Lab 03/02/14 0604 03/02/14 1050 03/02/14 1703  TROPONINI <0.30 <0.30 <0.30   BNP (last 3 results) No results found for this basename: PROBNP,  in the last 8760 hours CBG:  Recent Labs Lab 03/02/14 0745 03/02/14 1205 03/02/14 1652 03/02/14 2112 03/03/14 0703  GLUCAP 187* 194* 147* 213* 173*    No results found for this or any previous visit (from the past 240 hour(s)).   Studies: Ct Head Wo Contrast  03/01/2014   CLINICAL DATA:  SEIZURES SEIZURES  EXAM: CT HEAD WITHOUT CONTRAST  TECHNIQUE: Contiguous axial images were obtained from the base of the skull through the vertex without intravenous contrast.  COMPARISON:  None.  FINDINGS: Mild atrophy. Will There is no evidence of acute intracranial hemorrhage, brain edema, mass  lesion, acute infarction, mass effect, or midline shift. Acute infarct may be inapparent on noncontrast CT. No other intra-axial abnormalities are seen, and the ventricles and sulci are within normal limits in size and symmetry. No abnormal extra-axial fluid collections or masses are identified. No significant calvarial abnormality.  IMPRESSION: 1. Negative for bleed or other acute intracranial process.   Electronically Signed   By: Oley Balm M.D.   On: 03/01/2014 19:17    Scheduled Meds: . aspirin  325 mg Oral Daily  . cefTRIAXone (ROCEPHIN)  IV  1 g Intravenous Q24H  . divalproex  500 mg Oral BID  . enoxaparin (LOVENOX) injection  40  mg Subcutaneous Q24H  . folic acid  500 mcg Oral Daily  . gabapentin  100 mg Oral QHS  . insulin aspart  0-9 Units Subcutaneous TID WC  . levETIRAcetam  1,000 mg Oral BID   Continuous Infusions:    Principal Problem:   Seizure Active Problems:   UTI (lower urinary tract infection)   Valproic acid causing adverse effect in therapeutic use   Diabetes mellitus type 2, controlled    Time spent: 25 minutes.     [name]  [facility] Pager [phone]. If 7PM-7AM, please contact night-coverage at [email or web address], password [facility] 03/03/2014, 8:14 AM  LOS: 2 days

## 2014-03-09 ENCOUNTER — Emergency Department (HOSPITAL_BASED_OUTPATIENT_CLINIC_OR_DEPARTMENT_OTHER): Payer: PRIVATE HEALTH INSURANCE

## 2014-03-09 ENCOUNTER — Encounter (HOSPITAL_BASED_OUTPATIENT_CLINIC_OR_DEPARTMENT_OTHER): Payer: Self-pay | Admitting: Emergency Medicine

## 2014-03-09 ENCOUNTER — Emergency Department (HOSPITAL_BASED_OUTPATIENT_CLINIC_OR_DEPARTMENT_OTHER)
Admission: EM | Admit: 2014-03-09 | Discharge: 2014-03-09 | Disposition: A | Discharge status: Home / Self Care | Payer: PRIVATE HEALTH INSURANCE | Attending: Emergency Medicine | Admitting: Emergency Medicine

## 2014-03-09 DIAGNOSIS — Z792 Long term (current) use of antibiotics: Secondary | ICD-10-CM | POA: Diagnosis not present

## 2014-03-09 DIAGNOSIS — I10 Essential (primary) hypertension: Secondary | ICD-10-CM | POA: Diagnosis not present

## 2014-03-09 DIAGNOSIS — Z794 Long term (current) use of insulin: Secondary | ICD-10-CM | POA: Diagnosis not present

## 2014-03-09 DIAGNOSIS — S335XXA Sprain of ligaments of lumbar spine, initial encounter: Secondary | ICD-10-CM | POA: Insufficient documentation

## 2014-03-09 DIAGNOSIS — S0993XA Unspecified injury of face, initial encounter: Secondary | ICD-10-CM | POA: Insufficient documentation

## 2014-03-09 DIAGNOSIS — S139XXA Sprain of joints and ligaments of unspecified parts of neck, initial encounter: Secondary | ICD-10-CM | POA: Diagnosis not present

## 2014-03-09 DIAGNOSIS — E119 Type 2 diabetes mellitus without complications: Secondary | ICD-10-CM | POA: Insufficient documentation

## 2014-03-09 DIAGNOSIS — S199XXA Unspecified injury of neck, initial encounter: Secondary | ICD-10-CM

## 2014-03-09 DIAGNOSIS — Z79899 Other long term (current) drug therapy: Secondary | ICD-10-CM | POA: Insufficient documentation

## 2014-03-09 DIAGNOSIS — Y9389 Activity, other specified: Secondary | ICD-10-CM | POA: Insufficient documentation

## 2014-03-09 DIAGNOSIS — Y92410 Unspecified street and highway as the place of occurrence of the external cause: Secondary | ICD-10-CM | POA: Diagnosis not present

## 2014-03-09 DIAGNOSIS — G40909 Epilepsy, unspecified, not intractable, without status epilepticus: Secondary | ICD-10-CM | POA: Diagnosis not present

## 2014-03-09 DIAGNOSIS — S39012A Strain of muscle, fascia and tendon of lower back, initial encounter: Secondary | ICD-10-CM

## 2014-03-09 DIAGNOSIS — S161XXA Strain of muscle, fascia and tendon at neck level, initial encounter: Secondary | ICD-10-CM

## 2014-03-09 DIAGNOSIS — Z7982 Long term (current) use of aspirin: Secondary | ICD-10-CM | POA: Insufficient documentation

## 2014-03-09 MED ORDER — HYDROCODONE-ACETAMINOPHEN 5-325 MG PO TABS: 1.0000 | ORAL_TABLET | Freq: Four times a day (QID) | ORAL | Status: AC | PRN

## 2014-03-09 MED ORDER — CYCLOBENZAPRINE HCL 5 MG PO TABS: 5.0000 mg | ORAL_TABLET | Freq: Two times a day (BID) | ORAL | Status: AC | PRN

## 2014-03-09 NOTE — Discharge Instructions (Signed)
Cervical Sprain °A cervical sprain is when the tissues (ligaments) that hold the neck bones in place stretch or tear. °HOME CARE  °· Put ice on the injured area. °· Put ice in a plastic bag. °· Place a towel between your skin and the bag. °· Leave the ice on for 15-20 minutes, 3-4 times a day. °· You may have been given a collar to wear. This collar keeps your neck from moving while you heal. °· Do not take the collar off unless told by your doctor. °· If you have long hair, keep it outside of the collar. °· Ask your doctor before changing the position of your collar. You may need to change its position over time to make it more comfortable. °· If you are allowed to take off the collar for cleaning or bathing, follow your doctor's instructions on how to do it safely. °· Keep your collar clean by wiping it with mild soap and water. Dry it completely. If the collar has removable pads, remove them every 1-2 days to hand wash them with soap and water. Allow them to air dry. They should be dry before you wear them in the collar. °· Do not drive while wearing the collar. °· Only take medicine as told by your doctor. °· Keep all doctor visits as told. °· Keep all physical therapy visits as told. °· Adjust your work station so that you have good posture while you work. °· Avoid positions and activities that make your problems worse. °· Warm up and stretch before being active. °GET HELP IF: °· Your pain is not controlled with medicine. °· You cannot take less pain medicine over time as planned. °· Your activity level does not improve as expected. °GET HELP RIGHT AWAY IF:  °· You are bleeding. °· Your stomach is upset. °· You have an allergic reaction to your medicine. °· You develop new problems that you cannot explain. °· You lose feeling (become numb) or you cannot move any part of your body (paralysis). °· You have tingling or weakness in any part of your body. °· Your symptoms get worse. Symptoms include: °· Pain,  soreness, stiffness, puffiness (swelling), or a burning feeling in your neck. °· Pain when your neck is touched. °· Shoulder or upper back pain. °· Limited ability to move your neck. °· Headache. °· Dizziness. °· Your hands or arms feel week, lose feeling, or tingle. °· Muscle spasms. °· Difficulty swallowing or chewing. °MAKE SURE YOU:  °· Understand these instructions. °· Will watch your condition. °· Will get help right away if you are not doing well or get worse. °Document Released: 12/04/2007 Document Revised: 02/17/2013 Document Reviewed: 12/23/2012 °ExitCare® Patient Information ©2015 [employer]. This information is not intended to replace advice given to you by your health care provider. Make sure you discuss any questions you have with your health care provider. ° °Lumbosacral Strain °Lumbosacral strain is a strain of any of the parts that make up your lumbosacral vertebrae. Your lumbosacral vertebrae are the bones that make up the lower third of your backbone. Your lumbosacral vertebrae are held together by muscles and tough, fibrous tissue (ligaments).  °CAUSES  °A sudden blow to your back can cause lumbosacral strain. Also, anything that causes an excessive stretch of the muscles in the low back can cause this strain. This is typically seen when people exert themselves strenuously, fall, lift heavy objects, bend, or crouch repeatedly. °RISK FACTORS °· Physically demanding work. °· Participation in pushing   or pulling sports or sports that require a sudden twist of the back (tennis, golf, baseball). °· Weight lifting. °· Excessive lower back curvature. °· Forward-tilted pelvis. °· Weak back or abdominal muscles or both. °· Tight hamstrings. °SIGNS AND SYMPTOMS  °Lumbosacral strain may cause pain in the area of your injury or pain that moves (radiates) down your leg.  °DIAGNOSIS °Your health care provider can often diagnose lumbosacral strain through a physical exam. In some cases, you may need tests  such as X-ray exams.  °TREATMENT  °Treatment for your lower back injury depends on many factors that your clinician will have to evaluate. However, most treatment will include the use of anti-inflammatory medicines. °HOME CARE INSTRUCTIONS  °· Avoid hard physical activities (tennis, racquetball, waterskiing) if you are not in proper physical condition for it. This may aggravate or create problems. °· If you have a back problem, avoid sports requiring sudden body movements. Swimming and walking are generally safer activities. °· Maintain good posture. °· Maintain a healthy weight. °· For acute conditions, you may put ice on the injured area. °¨ Put ice in a plastic bag. °¨ Place a towel between your skin and the bag. °¨ Leave the ice on for 20 minutes, 2-3 times a day. °· When the low back starts healing, stretching and strengthening exercises may be recommended. °SEEK MEDICAL CARE IF: °· Your back pain is getting worse. °· You experience severe back pain not relieved with medicines. °SEEK IMMEDIATE MEDICAL CARE IF:  °· You have numbness, tingling, weakness, or problems with the use of your arms or legs. °· There is a change in bowel or bladder control. °· You have increasing pain in any area of the body, including your belly (abdomen). °· You notice shortness of breath, dizziness, or feel faint. °· You feel sick to your stomach (nauseous), are throwing up (vomiting), or become sweaty. °· You notice discoloration of your toes or legs, or your feet get very cold. °MAKE SURE YOU:  °· Understand these instructions. °· Will watch your condition. °· Will get help right away if you are not doing well or get worse. °Document Released: 03/27/2005 Document Revised: 06/22/2013 Document Reviewed: 02/03/2013 °ExitCare® Patient Information ©2015 [employer]. This information is not intended to replace advice given to you by your health care provider. Make sure you discuss any questions you have with your health care  provider. ° °
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
## 2014-03-09 NOTE — ED Notes (Signed)
NP at bedside.

## 2014-03-10 NOTE — ED Provider Notes (Signed)
  Medical screening examination/treatment/procedure(s) were performed by non-physician practitioner and as supervising physician I was immediately available for consultation/collaboration.   EKG Interpretation None         Gerhard Munch, MD 03/10/14 0011

## 2014-03-18 DIAGNOSIS — E785 Hyperlipidemia, unspecified: Secondary | ICD-10-CM | POA: Diagnosis present

## 2014-11-28 IMAGING — CR DG CERVICAL SPINE COMPLETE 4+V
6 series · 6 of 6 positions shown · non-contrast
Comparison: None.

CLINICAL DATA: Pain post trauma

EXAM:
CERVICAL SPINE  4+ VIEWS

[w c-spine lat]
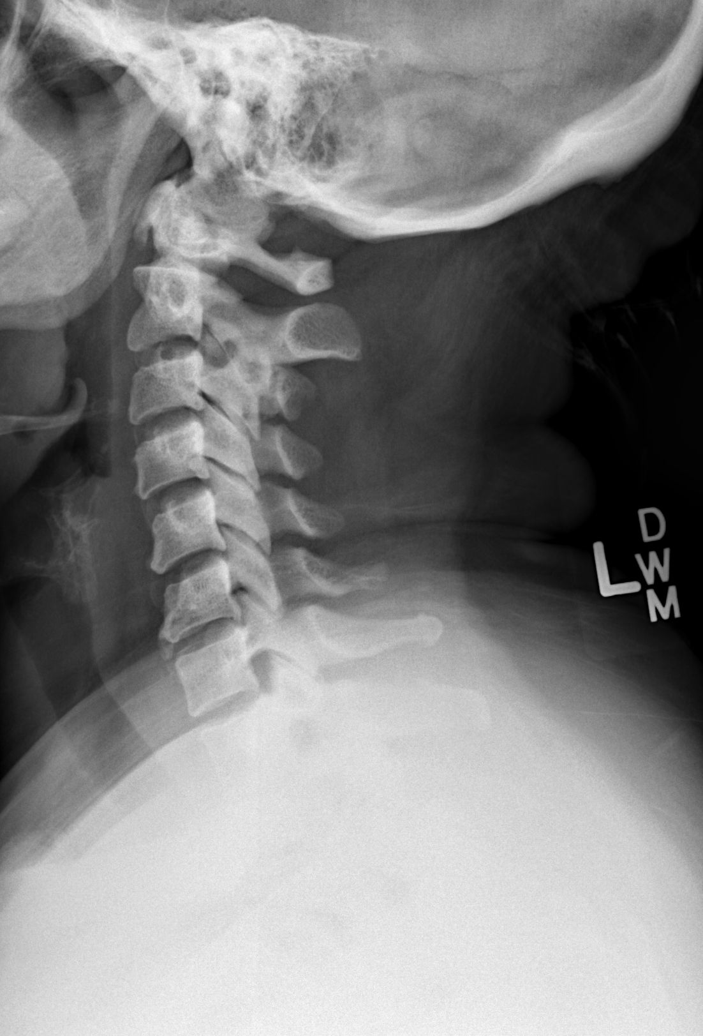

[w c-spine oblique (1 of 2)]
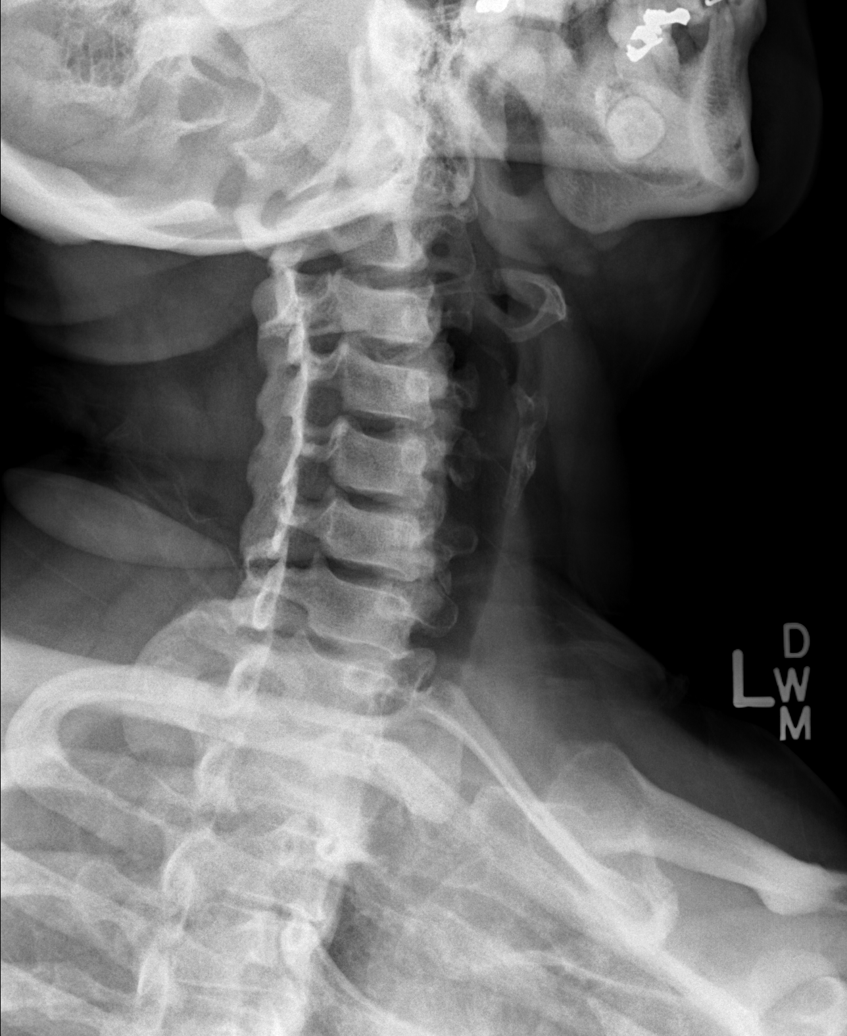

[w c-spine oblique (2 of 2)]
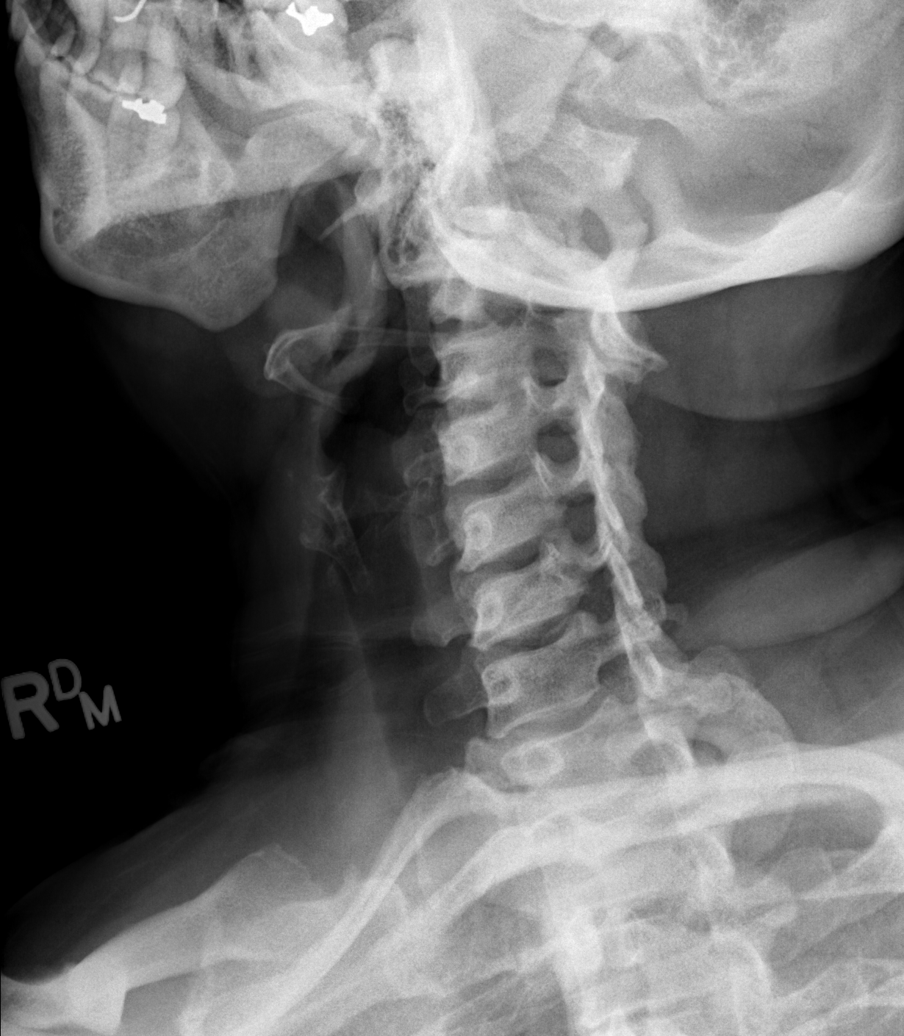

[w c-spine a.p.]
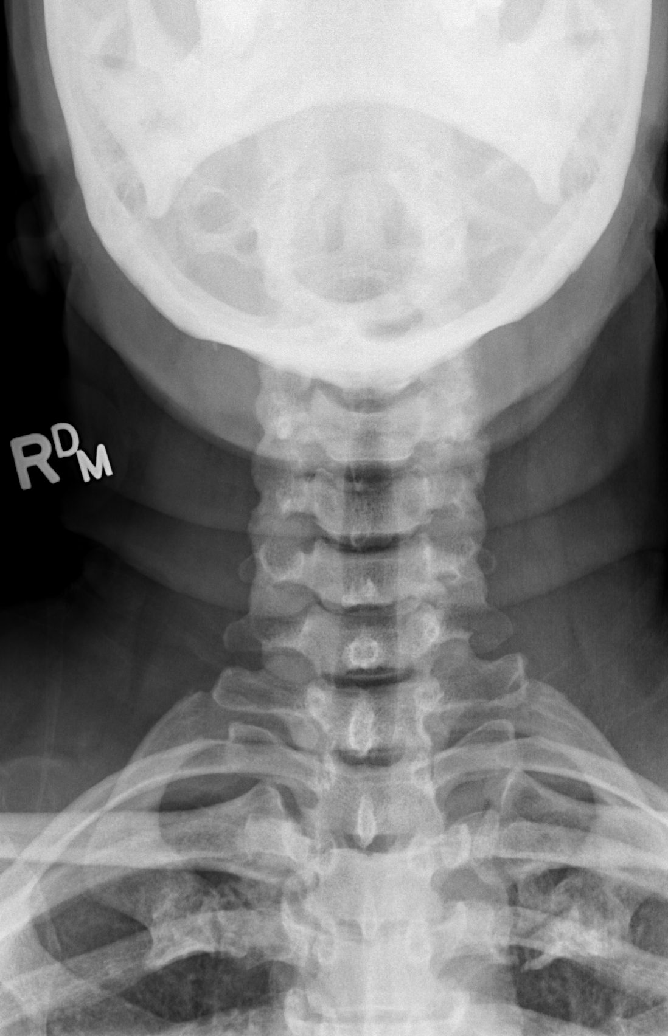

[w c-spine odontoid]
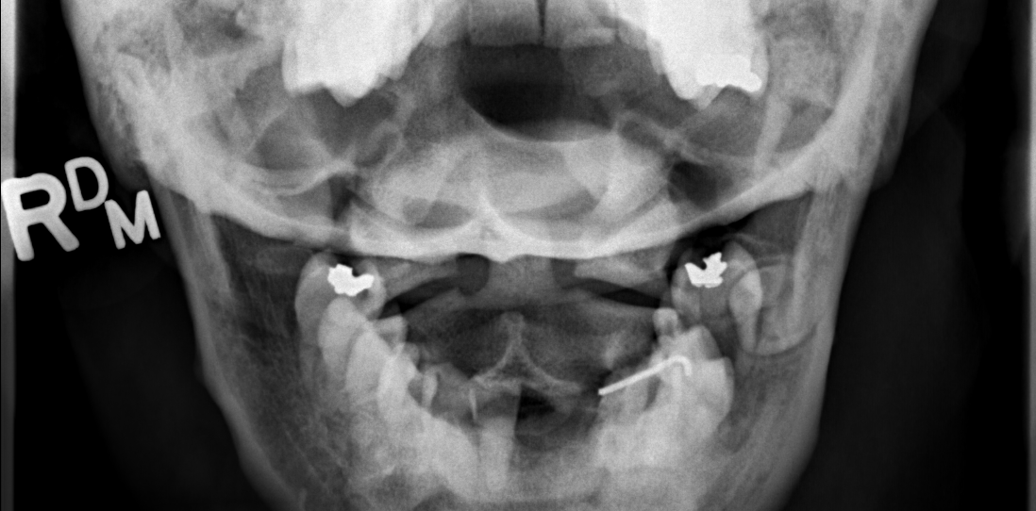

[w swimmers view]
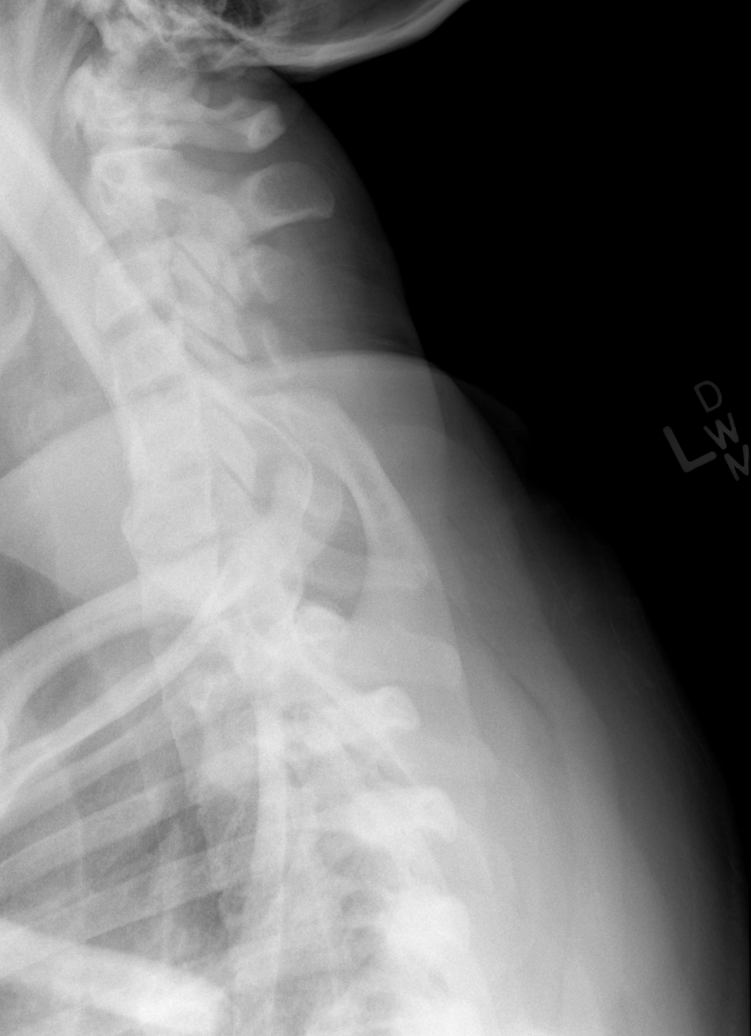

[6 of 6 positions shown; findings below may reference images not displayed]

FINDINGS: Frontal, lateral, open-mouth odontoid, and bilateral oblique views
were obtained. There is no fracture or spondylolisthesis.
Prevertebral soft tissues and predental space regions are normal.
There is slight disc space narrowing at C6-7. There are small
anterior osteophytes at C4, C5, and C6. There is no appreciable exit
foraminal narrowing on the oblique views.
IMPRESSION: Mild osteoarthritic change.  No fracture or spondylolisthesis.

## 2016-05-03 DIAGNOSIS — R0602 Shortness of breath: Secondary | ICD-10-CM

## 2018-08-26 ENCOUNTER — Other Ambulatory Visit: Payer: Self-pay | Admitting: Neurology

## 2018-08-26 DIAGNOSIS — M5412 Radiculopathy, cervical region: Secondary | ICD-10-CM

## 2018-09-08 ENCOUNTER — Other Ambulatory Visit: Payer: Self-pay | Admitting: Obstetrics & Gynecology

## 2018-09-08 DIAGNOSIS — Z1239 Encounter for other screening for malignant neoplasm of breast: Secondary | ICD-10-CM

## 2018-09-14 ENCOUNTER — Other Ambulatory Visit: Payer: Self-pay

## 2018-09-14 ENCOUNTER — Ambulatory Visit
Admission: RE | Admit: 2018-09-14 | Discharge: 2018-09-14 | Disposition: A | Discharge status: Home / Self Care | Payer: Self-pay | Source: Ambulatory Visit

## 2018-09-14 ENCOUNTER — Ambulatory Visit
Admission: RE | Admit: 2018-09-14 | Discharge: 2018-09-14 | Disposition: A | Discharge status: Home / Self Care | Payer: No Typology Code available for payment source | Source: Ambulatory Visit | Attending: Obstetrics & Gynecology | Admitting: Obstetrics & Gynecology

## 2018-09-14 ENCOUNTER — Ambulatory Visit
Admission: RE | Admit: 2018-09-14 | Discharge: 2018-09-14 | Disposition: A | Discharge status: Home / Self Care | Payer: No Typology Code available for payment source | Source: Ambulatory Visit | Attending: Neurology | Admitting: Neurology

## 2018-09-14 ENCOUNTER — Other Ambulatory Visit: Payer: Self-pay | Admitting: Obstetrics & Gynecology

## 2018-09-14 DIAGNOSIS — M5031 Other cervical disc degeneration,  high cervical region: Secondary | ICD-10-CM | POA: Insufficient documentation

## 2018-09-14 DIAGNOSIS — M5021 Other cervical disc displacement,  high cervical region: Secondary | ICD-10-CM | POA: Insufficient documentation

## 2018-09-14 DIAGNOSIS — Z1239 Encounter for other screening for malignant neoplasm of breast: Secondary | ICD-10-CM

## 2018-09-14 DIAGNOSIS — M438X2 Other specified deforming dorsopathies, cervical region: Secondary | ICD-10-CM | POA: Insufficient documentation

## 2018-09-14 DIAGNOSIS — M5412 Radiculopathy, cervical region: Secondary | ICD-10-CM | POA: Insufficient documentation

## 2018-09-14 DIAGNOSIS — M4802 Spinal stenosis, cervical region: Secondary | ICD-10-CM | POA: Insufficient documentation

## 2018-09-14 DIAGNOSIS — M47892 Other spondylosis, cervical region: Secondary | ICD-10-CM | POA: Insufficient documentation

## 2018-09-14 DIAGNOSIS — Z1231 Encounter for screening mammogram for malignant neoplasm of breast: Secondary | ICD-10-CM | POA: Insufficient documentation

## 2020-05-17 DIAGNOSIS — I5032 Chronic diastolic (congestive) heart failure: Secondary | ICD-10-CM | POA: Diagnosis present

## 2020-08-29 DIAGNOSIS — D649 Anemia, unspecified: Secondary | ICD-10-CM | POA: Diagnosis present

## 2020-08-29 DIAGNOSIS — K219 Gastro-esophageal reflux disease without esophagitis: Secondary | ICD-10-CM | POA: Diagnosis present

## 2021-01-09 DIAGNOSIS — N1831 Chronic kidney disease, stage 3a: Secondary | ICD-10-CM | POA: Diagnosis present

## 2021-01-28 DIAGNOSIS — G4733 Obstructive sleep apnea (adult) (pediatric): Secondary | ICD-10-CM | POA: Diagnosis present

## 2021-03-07 ENCOUNTER — Other Ambulatory Visit: Payer: Self-pay | Admitting: Adult Health

## 2021-03-09 ENCOUNTER — Other Ambulatory Visit: Payer: Self-pay | Admitting: Adult Health

## 2021-03-09 DIAGNOSIS — N644 Mastodynia: Secondary | ICD-10-CM

## 2021-03-23 ENCOUNTER — Other Ambulatory Visit (INDEPENDENT_AMBULATORY_CARE_PROVIDER_SITE_OTHER): Payer: Self-pay | Admitting: Adult Health

## 2021-03-28 ENCOUNTER — Telehealth: Payer: Self-pay

## 2021-03-28 NOTE — Telephone Encounter (Signed)
[facility] New Patient Coordinator Note    Physician/Location Preference:    Location Preference: first available     Physician Preference: first available     Referral:    Referring Provider: Joannie Springs NP    Is Referral required per insurance? No     History:    Personal Hx of Cancer: no     Prior Chemotherapy - no   Prior Radiation -no      Prior Surgery related to Cancer - no     Family Hx of Cancer : no     Biopsy History:    No     Imaging History:    Prior Imaging: yes     Type of Imaging: mamm, sono   Location Performed: [facility]    Other:     Are there patient owned records that will be brought to the first appointment?  No     Has the Appointment been scheduled?   Pt is scheduled with NP Osvaldo Angst on 04/26/21 at 10:30am FFX

## 2021-04-05 ENCOUNTER — Encounter: Payer: Self-pay | Admitting: Registered Nurse

## 2021-04-05 NOTE — Progress Notes (Unsigned)
[facility] Breast Surgery Consultation    HPI   CC: Breast pain, left    [name] is a 70 y.o. female patient here for complaints of left breast pain.      Recent Imaging:  03/23/2021. BDM and Bilateral breast ultrasounds- No mammographic or sonnographic evidence for malignancy. Benign ductal ectasia. BIRADS2    Family History of Breast or Ovarian Cancer:  She has no family history of breast or ovarian cancer. She has sisters, maternal aunts, and paternal aunts.    History of Breast Surgery or Breast Biopsy:  She has no history of breast surgery or breast biopsy.     The following portions of the patient's history were reviewed and updated as appropriate: allergies, current medications, past family history, past medical history, past social history, past surgical history and problem list.     has no past medical history on file.    Family History     Family History   Problem Relation Age of Onset    Breast cancer Maternal Aunt         Review of Systems   ROS:  Constitutional:  {[facility] AMB BCC Constitution:22304}  HEENT:  {[facility] AMB BCC HEENT:22305}  Cardiovascular:  {[facility] AMB BCC Cardiovascular:22306}  Respiratory:  {[facility] AMB BCC Respiratory:22307}  Gastrointestinal:  {[facility] AMB BCC Gastrointestinal:22308}  Genitourinary:  {[facility] AMB BCC Gynecologic:22316}  Musculoskeletal:  {[facility] AMB BCC Musculoskeletal:22310}  Skin:  {I[facility] AMB BCC Skin:22311}  Neurologic:  {[facility] AMB BCC Neurologic:22312}  Psychiatric:  {[facility] AMB BCC Psychiatric:22313}  Endocrine:  {[facility] AMB BCC Endocrine:22314}  Hematologic:  {[facility] AMB BCC Hematologic:22315}                     Physical Exam   There were no vitals taken for this visit.    WDWN female in NAD  HEENT:  Clear, no scleral icterus, neck supple  Neck:  No thyromegaly or masses, trachea midline  Chest:  Respiratory effort normal  CV:  No pedal edema  BJM:  Gait and station normal  Ext:  No CCE  Skin:  Free of significant ulcers or lesions  Neuro:  Grossly intact, no focal findings, alert and oriented,  asks appropriate questions  LN:  No axillary, supraclavicular or cervical adenopathy  Breast:  Symmetric . Ptosis ***   Right breast: No skin or nipple changes, no nipple retraction or discharge . No palpable masses.   Left breast : No skin or nipple changes, no nipple retraction or discharge . No palpable masses.    Rads   Radiological imaging and reports reviewed as above.     Assessment/Plan   Left breast pain    In consultation with the patient, I reviewed the finding of today's physical exam and most recent imaging. Her bilateral breasts are without discrete masses. Her most recent imaging shows no suspicious findings.     I have reviewed the breast pain handout with the patient. She should    She should follow up in 3 months. Sooner with concerns.     She agrees with the plan.

## 2021-04-06 ENCOUNTER — Ambulatory Visit: Payer: No Typology Code available for payment source | Attending: Registered Nurse | Admitting: Registered Nurse

## 2021-04-06 ENCOUNTER — Encounter: Payer: Self-pay | Admitting: Registered Nurse

## 2021-04-06 VITALS — BP 137/79 | HR 95 | Temp 98.0°F | Resp 17 | Ht 60.0 in | Wt 165.0 lb

## 2021-04-06 DIAGNOSIS — N644 Mastodynia: Secondary | ICD-10-CM | POA: Insufficient documentation

## 2021-04-06 NOTE — Patient Instructions (Signed)
Voltaren Cream prn pain

## 2021-04-26 ENCOUNTER — Ambulatory Visit: Payer: No Typology Code available for payment source | Admitting: Nurse Practitioner

## 2021-07-06 ENCOUNTER — Encounter: Payer: Self-pay | Admitting: Registered Nurse

## 2021-07-06 NOTE — Progress Notes (Unsigned)
[facility] Consultation    HPI   CC: Breast pain, bilateral    [name] is a 71 y.o. female patient here in follow up to complaints of bilateral breast pain.    She was initially seen in the office on 04/06/2021 and reported complaints of left breast pain. Pain was located around her nipple, axilla, and the entire breast. She described the pain to feel like a throbbing pain. Intermittent. She denied palpable masses, skin changes, nipple changes, and nipple discharge.     She also had complaints of occasional right breast pain around her areola. Intermittent. She denied palpable masses, skin changes, nipple changes, and nipple discharge.     She ambulates with a walker for her h/o sciatica.     She wears a bra with under wire. She drinks 3-4 cups of drinks with caffeine/day. She has recently gained weight. Approximately 7 pounds.     Recent Imaging:  03/23/2021. BDM and Bilateral breast ultrasounds- No mammographic or sonnographic evidence for malignancy. Benign ductal ectasia. BIRADS2    Family History of Breast or Ovarian Cancer:  She has no family history ovarian cancer. She has an aunt who has had breast cancer. She is unsure of the age of her diagnosis. She has 5 sisters, 3 maternal aunts, and 2 paternal aunts.    History of Breast Surgery or Breast Biopsy:  She reports no history of breast surgery or breast biopsy.  Of note, she does have a biopsy marker at the 9:00 axis of the right breast. She does not remember having the biopsy.     The following portions of the patient's history were reviewed and updated as appropriate: allergies, current medications, past family history, past medical history, past social history, past surgical history and problem list.     has a past medical history of Chronic kidney disease, Convulsions, Diabetes mellitus, Hypertension, Lung disease, and Sleep apnea.    Family History     Family History   Problem Relation Age of Onset    Breast cancer Maternal Aunt          Review of Systems   ROS:  Constitutional:  negative  HEENT:  negative  Cardiovascular:  pain in legs at rest  Respiratory:  snoring  Gastrointestinal:  negative  Genitourinary:  negative  Musculoskeletal:   back pain  Skin:  negative  Neurologic:  seizure  Psychiatric:  negative  Endocrine:  hair loss, hot flashes  Hematologic:  negative                     Physical Exam   There were no vitals taken for this visit.    WDWN female in NAD  HEENT:  Clear, no scleral icterus, neck supple  Neck:  No thyromegaly or masses, trachea midline  Chest:  Respiratory effort normal  CV:  No pedal edema  BJM:  Gait and station normal  Ext:  No CCE  Skin:  Free of significant ulcers or lesions  Neuro:  Grossly intact, no focal findings, alert and oriented, asks appropriate questions  LN:  No axillary, supraclavicular or cervical adenopathy  Breast:  Symmetric . Ptosis grade 3   Right breast: No skin or nipple changes, no nipple retraction or discharge . No palpable masses.   Left breast : No skin or nipple changes, no nipple retraction or discharge . No palpable masses.    Rads   Radiological imaging and reports reviewed as above.  Assessment/Plan   breast pain, Bilateral    In consultation with the patient, I reviewed the finding of today's physical exam and most recent imaging. Her bilateral breasts are without discrete masses. Her most recent imaging shows no suspicious findings.     I reassured the patient that breast pain is rarely a sign of breast cancer. I have reviewed the breast pain handout with the patient and provided her a copy. I recommend she have a bra fitting, wear a bra with under wire, decrease caffeine intake, start evening primrose oil, and keep a log of breast pain triggers. She may take NSAID or apply Voltaren gel to the breast PRN for pain.     She should follow up in 3 months. Sooner with worsening concerns.     She agrees with the plan.

## 2021-07-09 ENCOUNTER — Telehealth: Payer: Self-pay

## 2021-07-09 ENCOUNTER — Ambulatory Visit: Payer: No Typology Code available for payment source | Admitting: Registered Nurse

## 2021-07-09 DIAGNOSIS — N644 Mastodynia: Secondary | ICD-10-CM

## 2021-07-09 NOTE — Telephone Encounter (Signed)
left a voicemail, late for appt, requested a call back to r/s

## 2022-03-26 ENCOUNTER — Ambulatory Visit (INDEPENDENT_AMBULATORY_CARE_PROVIDER_SITE_OTHER): Payer: Medicare Other | Admitting: Neurology

## 2022-03-26 ENCOUNTER — Encounter (HOSPITAL_BASED_OUTPATIENT_CLINIC_OR_DEPARTMENT_OTHER): Payer: Self-pay | Admitting: Neurology

## 2022-03-26 VITALS — BP 138/80 | HR 75 | Temp 98.1°F | Ht 60.0 in | Wt 173.0 lb

## 2022-03-26 DIAGNOSIS — R569 Unspecified convulsions: Secondary | ICD-10-CM

## 2022-03-26 MED ORDER — CARBAMAZEPINE ER 200 MG PO TB12
200.0000 mg | ORAL_TABLET | Freq: Two times a day (BID) | ORAL | 2 refills | Status: DC
Start: 2022-03-26 — End: 2022-08-09

## 2022-03-26 NOTE — Progress Notes (Signed)
Subjective:      Patient ID: Tracey Sanchez is a 71 y.o. female.    HPI    The patient is a pleasant 71 years old right-handed African-American female who is here for establishing new neurologist for continued care of seizure disorder.  The patient reports that she has been diagnosed with.  Mall seizures since the age of 60, continues to have multiple episodes a day where she may have trouble speaking, stuttering, she may blank and stare for few seconds, these episodes may occur multiple times a day for at least 1 to 2 weeks and then she may not have any episode for at least 1 week and then may start recurring again.  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  She has been taking Keppra 1000 mg twice daily.  She has extensive work-up done including ambulatory EEG for a week at home, no report is available to review.  She was prescribed Fycompa 2 mg but because of the cost she was not able to continue.    Prior medications tried include phenobarbital, Depakote, Dilantin.    Allergies   Allergen Reactions    Dilantin [Phenytoin]     Codeine Other (See Comments) and Anxiety     Other reaction(s): Other (see comments), other/intolerance         The following portions of the patient's history were reviewed and updated as appropriate: allergies, current medications, past family history, past medical history, past social history, past surgical history, and problem list.    Past Medical History:   Diagnosis Date    Chronic kidney disease     Convulsions     Diabetes mellitus     Hypertension     Lung disease     Sleep apnea      Current Outpatient Medications on File Prior to Visit   Medication Sig Dispense Refill    amLODIPine-atorvastatin (CADUET) 2.5-10 MG per tablet Take 1 tablet by mouth daily      atorvastatin (LIPITOR) 40 MG tablet Take 1 tablet (40 mg) by mouth daily      carvedilol (COREG) 12.5 MG tablet Take 1 tablet  (12.5 mg) by mouth      ezetimibe (ZETIA) 10 MG tablet Take 1 tablet (10 mg) by mouth daily      gabapentin (NEURONTIN) 300 MG capsule Take 1 capsule (300 mg) by mouth 3 (three) times daily      Insulin Degludec (TRESIBA SC) Inject 25 Units into the skin daily      Insulin Zinc Human (NOVOLIN L SC) Inject 15 Unit into the skin PT injects 15 units every morning      Insulin Zinc Human (NOVOLIN L SC) Inject 10 Unit into the skin Pt injects 10 units every night      levETIRAcetam (KEPPRA) 1000 MG tablet Take 1 tablet (1,000 mg) by mouth 3 (three) times daily      sodium bicarbonate 650 MG tablet Take 1 tablet (650 mg) by mouth 4 (four) times daily      Insulin Glargine-Lixisenatide (SOLIQUA SC) Inject 18 Unit into the skin (Patient not taking: Reported on 03/26/2022)       No current facility-administered medications on file prior to visit.     Review of Systems   Constitutional:  Positive for activity change and fatigue.   HENT: Negative.     Eyes: Negative.  Respiratory: Negative.     Cardiovascular: Negative.    Gastrointestinal: Negative.    Endocrine: Negative.    Genitourinary: Negative.    Musculoskeletal:  Positive for arthralgias.   Skin: Negative.    Allergic/Immunologic: Negative.    Neurological:  Positive for seizures.   Hematological: Negative.    Psychiatric/Behavioral:  Positive for sleep disturbance. The patient is nervous/anxious.          Family History   Problem Relation Age of Onset    Breast cancer Maternal Aunt      Objective:     Vitals:    03/26/22 0937   BP: 138/80   BP Site: Left arm   Patient Position: Sitting   Cuff Size: Medium   Pulse: 75   Temp: 98.1 F (36.7 C)   TempSrc: Temporal   SpO2: 97%   Weight: 78.5 kg (173 lb)   Height: 1.524 m (5')     Neurologic Exam    Constitutional: Vital signs reviewed.   Head: Normocephalic, atraumatic, neck supple no JVD  Eyes: No conjunctival injection. No discharge.   ENT: Mucous membranes moist   Neck: Normal range of motion. Non tender.    Respiratory/Chest: Clear to auscultation. No respiratory distress.   Cardiovascular: Regular rate and rhythm. No murmur.   Lower Extremity: No edema. No cyanosis.   Skin: Warm and dry. No rash.   Lymphatic: No cervical lymphadenopathy.   Psychiatric: Normal affect.     Mental Status: The patient was awake, alert, and oriented. Conversation   was appropriate. Speech was fluent, and the patient followed commands   consistently.  Cranial Nerves: Pupils were equally round and reactive to light. Extraocular  movements were intact, without nystagmus. The patient's face was symmetric,   and facial sensation was intact. Tongue and palate were midline.   Motor Exam: The patient had full strength throughout. Tone and bulk appeared   normal. There were no abnormal movements or tremor noted.   Sensation: Light touch, pinprick, and joint position sense were intact.   Coordination: Finger-to-nose and heel-to-shin testing was intact   without dysmetria. Romberg testing was negative.   Gait: Normal, with normal tandem walking.   Deep Tendon Reflexes: 2+ and symmetric throughout.   Toes were downgoing.    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  She has had extensive work-up in the past including MRI brain as well as ambulatory EEG twice, no report is available to review  We will try to obtain the medical records  We will add Tegretol 200 mg once daily for 2 to 3 days and then twice daily, the side effects including rash were discussed with the patient and she expressed understanding  She will continue Keppra 1000 mg twice daily  Seizure precautions  We will repeat regular EEG, if negative, she may need EMU monitoring based on how she is  responding to Tegretol  We will continue taking her other medications for diabetes, hypertension and dyslipidemia  Seizure precautions  Follow-up in 1 month after the above work-up        Plan:       The patient will return in follow-up as outlined above. If there  are any new neurologic symptoms or worsening of current symptoms, the patient is instructed to contact us by telephone.    Jackquline Denmark, MD  Neurology, Neurophysiology  [facility]

## 2022-03-28 ENCOUNTER — Encounter (HOSPITAL_BASED_OUTPATIENT_CLINIC_OR_DEPARTMENT_OTHER): Payer: Self-pay

## 2022-04-03 ENCOUNTER — Ambulatory Visit (HOSPITAL_BASED_OUTPATIENT_CLINIC_OR_DEPARTMENT_OTHER): Payer: Medicare Other | Admitting: Neurology

## 2022-04-03 DIAGNOSIS — R569 Unspecified convulsions: Secondary | ICD-10-CM

## 2022-04-03 NOTE — Progress Notes (Signed)
This is the EEG Tech observation Report .  This report is NOT a physicians interpretation or conclusion of the study.    EEG No: [id number]  Hrs of sleep: 5 hrs  Dominance: right   Last Meal: am  Caffeine:  No  Previous EEG: Yes.  Results:  unknown    Ms. Sobecki has a current medication list which includes the following prescription(s): amlodipine-atorvastatin, atorvastatin, carbamazepine, carvedilol, ezetimibe, gabapentin, insulin degludec, insulin glargine-lixisenatide, insulin zinc human, insulin zinc human, levetiracetam, and sodium bicarbonate.     History: Continued seizure since age of 30.    Description: PS, Awake, Drowsy, Asleep, and Alert  Activity Noted:  Low voltage alpha, excessive beta.  Low voltage generalized slowing while in asleep.    No clinical observed seizure or epileptiform discharge in this recording.    Technologist: Edwyna Shell, R. EEG T.

## 2022-04-08 ENCOUNTER — Telehealth: Payer: Self-pay | Admitting: Neurology

## 2022-04-08 NOTE — Telephone Encounter (Signed)
Pt called in regards of her constant seizures she would like to see if she can get a call back to follow up and see what she can do,she stated that the medications she is currently taking is not working. She is scheduled for 10/26 but she wants to get some advice about what she should do or recommend anything, please advise.

## 2022-04-09 NOTE — Telephone Encounter (Signed)
Physician made aware, called to f/u with Pt.

## 2022-04-19 ENCOUNTER — Telehealth: Payer: Self-pay

## 2022-04-19 NOTE — Telephone Encounter (Signed)
Patient called to report possible adverse reaction to carbamazepine. Patient has been taking the medication for 2 weeks and reports dizziness. She states the dizziness occurs mainly at nighttime, she feels like the room is spinning and causes her to feel nauseous. Her symptoms began approx 1 week ago. Monday night she became dizzy and fell, no injuries reported. Patient seeking guidance regarding the medication. Will route to provider for review.

## 2022-04-19 NOTE — Telephone Encounter (Signed)
Called patient back and informed her of Dr. Urban Gibson instructions. Patient verbalized understanding, will call back with update.

## 2022-04-25 ENCOUNTER — Encounter (HOSPITAL_BASED_OUTPATIENT_CLINIC_OR_DEPARTMENT_OTHER): Payer: Self-pay | Admitting: Neurology

## 2022-04-25 ENCOUNTER — Telehealth (HOSPITAL_BASED_OUTPATIENT_CLINIC_OR_DEPARTMENT_OTHER): Payer: Medicare Other

## 2022-04-25 MED ORDER — LACOSAMIDE 100 MG PO TABS
100.0000 mg | ORAL_TABLET | Freq: Two times a day (BID) | ORAL | 2 refills | Status: DC
Start: 2022-04-25 — End: 2022-08-09

## 2022-04-25 NOTE — Progress Notes (Signed)
EEG Report    The EEG was recorded on an 18-channel digital machine according to the International 10-20 system of electrode placement. Both referential and bipolar montages were performed.    The EEG was recorded with the patient awake, drowsy and asleep. The waking background activity in the occipital lobe consists of fairly well developed medium voltage 8-9 hz. activity that attenuates with eye opening. Some eye movement artifact and low voltage fast activity is seen frontally.     Drowsiness was achieved with generalized slowing of the background rhythm.     Intermittent photic stimulation produced a bilaterally symmetric driving response. No interhemispheric asymmetries are noted.    Hyperventilation was performed with good effort, producing generalized slowing of the background rhythm.     Limited EKG rhythm strip showed a regular rhythm.     INTERPRETATION: This routine EEG recorded during wakefulness and drowsiness was within normal limits for age. There is no evidence of epileptiform activity or slowing. A normal EEG, however, does not rule out epilepsy. Clinical correlation is therefore recommended.    Azayla Polo, MD  Neurology, Neurophysiology  [facility]

## 2022-04-25 NOTE — Progress Notes (Incomplete)
Subjective:      Patient ID: Armanda HeritageSusan L Ishmael is a 71 y.o. female.    HPI    Verbal consent has been obtained from the patient to conduct a video visit encounter to minimize exposure to COVID-19.  The patient is a pleasant  71 years old female who is being seen via telemedicine for follow-up of seizure disorder.  Since the last visit, she tried Tegretol that seems to have helped improve the frequency and severity of seizures but she started having side effects including dizziness, feeling nervous, falls and diarrhea and hence the medication was discontinued.  After stopping the medication, the seizures have restarted and recurred again.  The dose of Keppra was also increased to 1500 mg twice daily that has not helped much.  Her regular EEG is normal, the option of EMU monitoring was suggested  for further evaluation and treatment.    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  She has been taking Keppra 1000 mg twice daily.  She has extensive work-up done including ambulatory EEG for a week at home, no report is available to review.  She was prescribed Fycompa 2 mg but because of the cost she was not able to continue.    Prior medications tried include phenobarbital, Depakote, Dilantin.    Allergies   Allergen Reactions   . Dilantin [Phenytoin]    . Codeine Other (See Comments) and Anxiety     Other reaction(s): Other (see comments), other/intolerance         The following portions of the patient's history were reviewed and  updated as appropriate: allergies, current medications, past family history, past medical history, past social history, past surgical history, and problem list.    Past Medical History:   Diagnosis Date   . Chronic kidney disease    . Convulsions    . Diabetes mellitus    . Hypertension    . Lung disease    . Sleep apnea      Current Outpatient Medications on File Prior to Visit   Medication Sig Dispense Refill   . amLODIPine-atorvastatin (CADUET) 2.5-10 MG per tablet Take 1 tablet by mouth daily     . atorvastatin (LIPITOR) 40 MG tablet Take 1 tablet (40 mg) by mouth daily     . carvedilol (COREG) 12.5 MG tablet Take 1 tablet (12.5 mg) by mouth     . ezetimibe (ZETIA) 10 MG tablet Take 1 tablet (10 mg) by mouth daily     . gabapentin (NEURONTIN) 300 MG capsule Take 1 capsule (300 mg) by mouth 3 (three) times daily     . Insulin Degludec (TRESIBA SC) Inject 25 Units into the skin daily     . Insulin Zinc Human (NOVOLIN L SC) Inject 15 Unit into the skin PT injects 15 units every morning     . Insulin Zinc Human (NOVOLIN L SC) Inject 10 Unit into the skin Pt injects 10  units every night     . levETIRAcetam (KEPPRA) 1000 MG tablet Take 1 tablet (1,000 mg) by mouth 3 (three) times daily     . sodium bicarbonate 650 MG tablet Take 1 tablet (650 mg) by mouth 4 (four) times daily     . carBAMazepine (TEGretol  XR) 200 MG 12 hr tablet Take 1 tablet (200 mg) by mouth 2 (two) times daily (Patient not taking: Reported on 04/25/2022) 60 tablet 2   . Insulin Glargine-Lixisenatide (SOLIQUA SC) Inject 18 Unit into the skin (Patient not taking: Reported on 03/26/2022)       No current facility-administered medications on file prior to visit.     Review of Systems   Constitutional:  Positive for activity change and fatigue.   HENT: Negative.     Eyes: Negative.    Respiratory: Negative.     Cardiovascular: Negative.    Gastrointestinal: Negative.    Endocrine: Negative.    Genitourinary: Negative.    Musculoskeletal:  Positive for  arthralgias.   Skin: Negative.    Allergic/Immunologic: Negative.    Neurological:  Positive for seizures.   Hematological: Negative.    Psychiatric/Behavioral:  Positive for sleep disturbance. The patient is nervous/anxious.          Family History   Problem Relation Age of Onset   . Breast cancer Maternal Aunt      Objective:     Vitals:    04/25/22 1003   Weight: 77.6 kg (171 lb)   Height: 1.524 m (5')     Neurologic Exam    Constitutional: Vital signs reviewed.   Head: Normocephalic, atraumatic, neck supple no JVD  Eyes: No conjunctival injection. No discharge.   ENT: Mucous membranes moist   Neck: Normal range of motion. Non tender.   Respiratory/Chest: Clear to auscultation. No respiratory distress.   Cardiovascular: Regular rate and rhythm. No murmur.   Lower Extremity: No edema. No cyanosis.   Skin: Warm and dry. No rash.   Lymphatic: No cervical lymphadenopathy.   Psychiatric: Normal affect.     Mental Status: The patient was awake, alert, and oriented. Conversation   was appropriate. Speech was fluent, and the patient followed commands   consistently.  Cranial Nerves: Pupils were equally round and reactive to light. Extraocular  movements were intact, without nystagmus. The patient's face was symmetric,   and facial sensation was intact. Tongue and palate were midline.   Motor Exam: The patient had full strength throughout. Tone and bulk appeared   normal. There were no abnormal movements or tremor noted.   Sensation: Light touch, pinprick, and joint position sense were intact.   Coordination: Finger-to-nose and heel-to-shin testing was intact   without dysmetria. Romberg testing was negative.   Gait: Normal, with normal tandem walking.   Deep Tendon Reflexes: 2+ and symmetric throughout.   Toes were downgoing.     EEG: Unremarkable, no evidence of any epileptic activity.    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  She has had extensive work-up in the past including MRI brain as well as ambulatory EEG twice, no report is available to review.    She seems to had responded  well to Tegretol with no seizures at all but was unable to continue the medication because of the concerning side effects  The option of Vimpat was recommended, she will take 100 mg once daily and taper up the dose in 1 week up to twice daily  EMU monitoring was also suggested to characterize the seizures and optimize the medication  Seizure precautions  She will continue taking her other medications for diabetes, hypertension and dyslipidemia  Seizure precautions  Follow-up in  6 weeks.        Plan:       The patient will return in follow-up as outlined above. If there are any new neurologic symptoms or worsening of current symptoms, the patient is instructed to contact us by telephone.    Lynann Bologna, MD  Neurology, Neurophysiology  [facility]

## 2022-05-03 ENCOUNTER — Encounter: Payer: Self-pay | Admitting: Nurse Practitioner

## 2022-08-09 ENCOUNTER — Ambulatory Visit
Payer: Medicare Other | Attending: Student in an Organized Health Care Education/Training Program | Admitting: Student in an Organized Health Care Education/Training Program

## 2022-08-09 ENCOUNTER — Encounter: Payer: Self-pay | Admitting: Student in an Organized Health Care Education/Training Program

## 2022-08-09 VITALS — Ht 60.0 in | Wt 172.0 lb

## 2022-08-09 DIAGNOSIS — G4733 Obstructive sleep apnea (adult) (pediatric): Secondary | ICD-10-CM

## 2022-08-09 DIAGNOSIS — G40919 Epilepsy, unspecified, intractable, without status epilepticus: Secondary | ICD-10-CM

## 2022-08-09 DIAGNOSIS — R569 Unspecified convulsions: Secondary | ICD-10-CM

## 2022-08-09 MED ORDER — XCOPRI 14 X 150 MG & 14 X200 MG PO TBPK
1.0000 | ORAL_TABLET | Freq: Every evening | ORAL | 0 refills | Status: AC
Start: 2022-08-09 — End: ?

## 2022-08-09 MED ORDER — XCOPRI 14 X 50 MG & 14 X100 MG PO TBPK
1.0000 | ORAL_TABLET | Freq: Every evening | ORAL | 0 refills | Status: DC
Start: 2022-08-09 — End: 2022-08-16

## 2022-08-09 NOTE — Progress Notes (Signed)
 [facility] NEUROLOGY  NEW OUTPATIENT ENCOUNTER       TELEMEDICINE VISIT    Patient Name: Tracey Sanchez  MRN: [id number]  Primary Care MD: Narda Rutherford, MD Phone: [phone]    Date: 09/05/2022     CC:   Chief Complaint   Patient presents with    Seizures     In reference to check seizures          HPI: Tracey Sanchez is a 72 y.o. female with PMH HTN, DM, HLD who presents today for neurologic consultation regarding seizures. She is on the visit with her daughter.    Age at seizure onset: 80  Handedness: RH    Current ASMs:   LEV 1500mg  BID - (increased from 1000mg  BID to 1500mg  BID for 2 months)  Xcopri 12.5mg  nightly   Gabapentin 300mg  TID    Prior ASMs: PHB, VPA, PHT, LEV, CBZ - bad reaction, very sick/nauseous/vomiting/dizzy, PER  *not TPM or ZNS, CLB, LCM    Started having seizures at [age]e 16. Petit mal seizures.     Up until daughters adulthood, never saw her have GTC seizure until 2019.   From 2019 until now she has had maybe 8 GTCs.   Lives by herself. If she is home she will know she had a GTC because she will have had a tongue or cheek bite.   If she is not aware, it is only when she has a GTC.    Normally she has the smaller seizures. Now it is at the point where her body starts to jerk after the petit mal ones as well.   Last week she was in the kitchen fixing something to eat and dropped the food because body was jerking.  They have been worsening over time.  She state she is aware of them.    Before when she had them, she will sutter, lose train of thought, or eyes roll in the back of her head. They last seconds but back to back. If you did not know her you may not be able to tell.    She will be talking and then she will start repeating herself.   If it progresses she is aware that her body is jerking. Tries to keep herself in the bed.     Per patient - with smaller seizures - eyes roll back into her head, jerking and suttering a lot, does not think she loses time, and then upper body can  jerk, sometimse can lose a little strength in her legs. Hands can jerk.   Sometimes 40-60 per day because they can go on all day.   Can have them in her sleep.     The ones that progress to body jerks - maybe 30 seconds or less.    Tuesday - bad one  Wednesday - had one with some jerking    Sometimes will go 1-2 weeks without having any but then they will return.    Does not miss medication dosages.    Two GTCs daguhter saw - screamed out and then went into GTC in kitchen  At [employer] and putting food in car and she had a seizure.   When she comes out of it she will know she had one.     03/26/22 - previous outpt neuro note  "The patient is a pleasant 72 years old right-handed African-American female who is here for establishing new neurologist for continued care of seizure disorder.  The patient reports  that she has been diagnosed with.  Mall seizures since the age of 33, continues to have multiple episodes a day where she may have trouble speaking, stuttering, she may blank and stare for few seconds, these episodes may occur multiple times a day for at least 1 to 2 weeks and then she may not have any episode for at least 1 week and then may start recurring again.  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  She has been taking Keppra 1000 mg twice daily.  She has extensive work-up done including ambulatory EEG for a week at home, no report is available to review.  She was prescribed Fycompa 2 mg but because of the cost she was not able to continue.  Prior medications tried include phenobarbital, Depakote, Dilantin."    Episodes of:  Waking up having bitten tongue or incontinent: yes  Myoclonic jerks: yes      Seizure Type:  GTC  Desc: sometimes starts jerking more before LOC but sometimes it just happens suddenly; eyes rolling, convulsing, tongue bite; one time with urinary incontinence and once with bowel  incontinence  Duration: 1-2 minutes  Frequency: 2 months ago     ?Seizure Type:  FA  Desc: eyes roll back into her head, jerking and suttering a lot, does not think she loses time, and then upper body can jerk, sometimes can lose a little strength in her legs. Hands can jerk.   Duration: 30 seconds  Frequency: daily, can have 40-60 in a day recently    ?Seizure Type:  ?absence?  Desc: trouble speaking, stuttering, she may blank and stare  Duration: 30 seconds  Frequency: daily      Epilepsy Risk Factors:  Birth Weight: normal  Pregnancy Complications: none  Development:  normal  History of CNS Infection: no  History of Significant Head Trauma (LOC,Amnesia): no  Febrile Convulsion: no  Family History of Epilepsy: yes - sister with seizures starting at age 70 and she is now 92 - hers were worse, she has VNS; father started having seizures at age 43        Medications:  Current Outpatient Medications on File Prior to Visit   Medication Sig Dispense Refill    amLODIPine (NORVASC) 2.5 MG tablet Take 1 tablet (2.5 mg) by mouth daily      carvedilol (COREG) 12.5 MG tablet Take 1 tablet (12.5 mg) by mouth 2 (two) times daily with meals      ezetimibe (ZETIA) 10 MG tablet Take 1 tablet (10 mg) by mouth daily      gabapentin (NEURONTIN) 300 MG capsule Take by mouth 3 (three) times daily One ( 300 mg ) tablet in the morning , one ( 300 mg ) tablet in the afternoon and two tablet (600 mg ) at bed time      Insulin Degludec (TRESIBA SC) Inject 25 Units into the skin daily      Insulin Zinc Human (NOVOLIN L SC) Inject 15 Unit into the skin PT injects 15 units every morning and 10 unit in the evening      levETIRAcetam (KEPPRA) 1000 MG tablet Take 1 tablet (1,000 mg) by mouth 3 (three) times daily      sodium bicarbonate 650 MG tablet Take 1 tablet (650 mg) by mouth 4 (four) times daily       No current facility-administered medications on file prior to visit.  Past Medical History:  Past Medical History:   Diagnosis Date     Chronic kidney disease     Convulsions     Diabetes mellitus     Hypertension     Lung disease     Sleep apnea        Past Surgical History:  Past Surgical History:   Procedure Laterality Date    CHOLECYSTECTOMY      KNEE ARTHROSCOPY Left     REVISION, CARPAL TUNNEL Left     SHOULDER SURGERY         Allergies:  Dilantin [phenytoin] and Codeine    Family History:  Family History   Problem Relation Age of Onset    Heart disease Mother     Heart disease Father     Seizures Father     Seizures Sister     Breast cancer Maternal Aunt        Social History:  Social History     Tobacco Use    Smoking status: Never    Smokeless tobacco: Never   Vaping Use    Vaping Use: Never used   Substance Use Topics    Alcohol use: Yes     Comment: glass of wine occ    Drug use: Never       Other Social History:  Housing: lives alone  Driving: no  Education Level: 9th grade  Employment: worked as bus attendant for 18 years     Review of Systems:  Constitutional: Negative for fever.   Respiratory: Negative for shortness of breath.    Cardiovascular: Negative for chest pain.   Gastrointestinal: Negative for abdominal pain.   Neurological: Negative for visual changes, alteration in awareness, speech or language problems, facial droop, focal weakness, focal numbness, limb incoordination, gait ataxia, dysphagia, or dizziness.   Psychiatric/Behavioral: Negative for mood disturbance.   All other systems reviewed and are negative except pertinent positives as noted above in HPI.     PHYSICAL EXAM:    General Exam:  Ht 1.524 m (5')   Wt 78 kg (172 lb)   BMI 33.59 kg/m   Gen:  Well-developed, well-nourished.  No acute distress.  HEENT:  Normocephalic, atraumatic.  Neck:  Normal range of motion.   Lungs:  Normal effort.  Extrem:  No edema.  Skin:  No obvious rashes or lesions.   Psych: Mood and affect are appropriate.    Neuro Exam:  MENTAL STATUS:  Awake, alert, oriented x 3.  Affect is normal. Normal attention and concentration.  Follows  commands. Speech is fluent, non dysarthric.  Memory intact. Fund of knowledge appropriate for age/education.    CRANIAL NERVES:    CN III, IV, VI - EOMI.  No nystagmus.   CN VII - Facial movements symmetric.   CN VIII - Hearing intact to conversational speech.   CN IX, X - Normal phonation.  CN XI - Shoulder shrug symmetric.   CN XII - Tongue protrusion midline  MOTOR:  No pronator drift.  Antigravity throughout.   COORDINATION: Finger to nose without dysmetria. Rapid alternating movements are smooth and fast.   GAIT:  Normal gait.        INVESTIGATIONS:    Labs:    No results found for this or any previous visit.      Imaging:     Ambulatory EEG 2023 - Occasional generalized spike and wave discharges    08/16/2022   CLINICAL DATA:  Increased seizures   EXAM:  CT HEAD WITHOUT CONTRAST     TECHNIQUE:   Contiguous axial images were obtained from the base of the skull   through the vertex without intravenous contrast.     RADIATION DOSE REDUCTION: This exam was performed according to the   departmental dose-optimization program which includes automated   exposure control, adjustment of the mA and/or kV according to   patient size and/or use of iterative reconstruction technique.     COMPARISON:  01/06/2021     FINDINGS:   Brain: No evidence of acute infarction, hemorrhage, hydrocephalus,   extra-axial collection or mass lesion/mass effect.     Vascular: No hyperdense vessel or unexpected calcification.     Skull: Normal. Negative for fracture or focal lesion.     Sinuses/Orbits: No acute finding.     IMPRESSION:   Negative head CT.         ASSESSMENT AND PLAN:  VINEY ACOCELLA is a 72 y.o. female with PMH epilepsy (onset age 33), HTN, DM, HLD who presented to me on 08/09/22 regarding seizures. She started having seizures at age 60 which she refers to as "petit mal" and then later around 2019 she began having GTCs (has had ~8 total). She continues to have smaller seizures however where she senses eye rolling, stuttering,  hard to speak, and then sometimes progressing now to some body jerks. She states that these have increased recently and are happening even up to 40-60 times per day.  LEV was increased to 1500mg  BID two months ago for these episodes but she continues to have them so Xcopri samples were started and she is currently on 12.5mg  nightly without any change. Of note, she is also on GBP 300mg  TID. She has failed many other ASMs before as well. Given some concern whether these episodes are epileptic vs nonepileptic, we will plan for EMU admission to capture the episodes and then adjust medication. She will continue Xcopri for now. We will increase her Gabapentin in the interim as well. Prior [facility] was normal. Prior ambulatory EEG showed occasional generalized spike and wave discharges.    1. Intractable epilepsy without status epilepticus, unspecified epilepsy type        2. Seizures        3. OSA on CPAP            - Continue Levetiracetam 1500mg  twice daily    - Continue Xcopri for now    - Can increase Gabapentin as tolerated (e.g. 300/300/600 and then up to 600mg  three times daily as tolerated)    - Calvert Cantor (our epilepsy monitoring unit (EMU) nurse practitioner) or Monica Becton will call you to pick a week to schedule the EMU admission  - Plan for admission from Monday-Friday      COUNSELING:  Discussed seizure precautions, including but not limited to driving, operating machinery, heights, and swimming/bathing: yes    Risks & Side Effects of Newly Prescribed Medication, Discussed with Patient: yes      Patient was called via Vidyo on Epic for today's visit due to the current COVID-19 pandemic and to limit his risk of exposure.  Patient gave consent for video visit and noted understanding that this visit would be charged similar to an in-person visit.       I have spent 60 minutes on the day of service including face to face time with patient, coordinating care, record review, and documentation.     Diannia Ruder,  MD  [facility] Neurology  Board Certified in Adult Neurology, Epilepsy, and Clinical Neurophysiology by the ABPN

## 2022-08-09 NOTE — Patient Instructions (Addendum)
-   Increase Gabapentin

## 2022-08-16 ENCOUNTER — Inpatient Hospital Stay
Admission: AD | Admit: 2022-08-16 | Discharge: 2022-08-18 | DRG: 101 | Disposition: A | Discharge status: Home / Self Care | Payer: Medicare Other | Source: Other Acute Inpatient Hospital | Attending: Internal Medicine | Admitting: Internal Medicine

## 2022-08-16 ENCOUNTER — Telehealth: Payer: Self-pay | Admitting: Student in an Organized Health Care Education/Training Program

## 2022-08-16 DIAGNOSIS — M4802 Spinal stenosis, cervical region: Secondary | ICD-10-CM | POA: Diagnosis present

## 2022-08-16 DIAGNOSIS — E1165 Type 2 diabetes mellitus with hyperglycemia: Secondary | ICD-10-CM | POA: Diagnosis present

## 2022-08-16 DIAGNOSIS — G40909 Epilepsy, unspecified, not intractable, without status epilepticus: Principal | ICD-10-CM | POA: Diagnosis present

## 2022-08-16 DIAGNOSIS — I5032 Chronic diastolic (congestive) heart failure: Secondary | ICD-10-CM | POA: Diagnosis present

## 2022-08-16 DIAGNOSIS — G4733 Obstructive sleep apnea (adult) (pediatric): Secondary | ICD-10-CM | POA: Diagnosis present

## 2022-08-16 DIAGNOSIS — E875 Hyperkalemia: Secondary | ICD-10-CM | POA: Diagnosis present

## 2022-08-16 DIAGNOSIS — R569 Unspecified convulsions: Principal | ICD-10-CM

## 2022-08-16 DIAGNOSIS — Z79899 Other long term (current) drug therapy: Secondary | ICD-10-CM

## 2022-08-16 DIAGNOSIS — K219 Gastro-esophageal reflux disease without esophagitis: Secondary | ICD-10-CM | POA: Diagnosis present

## 2022-08-16 DIAGNOSIS — E785 Hyperlipidemia, unspecified: Secondary | ICD-10-CM | POA: Diagnosis present

## 2022-08-16 DIAGNOSIS — I130 Hypertensive heart and chronic kidney disease with heart failure and stage 1 through stage 4 chronic kidney disease, or unspecified chronic kidney disease: Secondary | ICD-10-CM | POA: Diagnosis present

## 2022-08-16 DIAGNOSIS — D649 Anemia, unspecified: Secondary | ICD-10-CM | POA: Diagnosis present

## 2022-08-16 DIAGNOSIS — Z794 Long term (current) use of insulin: Secondary | ICD-10-CM

## 2022-08-16 DIAGNOSIS — D631 Anemia in chronic kidney disease: Secondary | ICD-10-CM | POA: Diagnosis present

## 2022-08-16 DIAGNOSIS — N1831 Chronic kidney disease, stage 3a: Secondary | ICD-10-CM | POA: Diagnosis present

## 2022-08-16 DIAGNOSIS — E669 Obesity, unspecified: Secondary | ICD-10-CM | POA: Diagnosis present

## 2022-08-16 DIAGNOSIS — Z6833 Body mass index (BMI) 33.0-33.9, adult: Secondary | ICD-10-CM

## 2022-08-16 DIAGNOSIS — E1122 Type 2 diabetes mellitus with diabetic chronic kidney disease: Secondary | ICD-10-CM | POA: Diagnosis present

## 2022-08-16 LAB — COMPREHENSIVE METABOLIC PANEL
ALT: 15 U/L (ref 0–55)
AST (SGOT): 14 U/L (ref 5–41)
Albumin/Globulin Ratio: 0.9 (ref 0.9–2.2)
Albumin: 3.1 g/dL — ABNORMAL LOW (ref 3.5–5.0)
Alkaline Phosphatase: 106 U/L (ref 37–117)
Anion Gap: 7 (ref 5.0–15.0)
BUN: 22 mg/dL — ABNORMAL HIGH (ref 7.0–21.0)
Bilirubin, Total: 0.2 mg/dL (ref 0.2–1.2)
CO2: 23 mEq/L (ref 17–29)
Calcium: 8.4 mg/dL — ABNORMAL LOW (ref 8.5–10.5)
Chloride: 111 mEq/L (ref 99–111)
Creatinine: 1.5 mg/dL — ABNORMAL HIGH (ref 0.4–1.0)
Globulin: 3.3 g/dL (ref 2.0–3.6)
Glucose: 259 mg/dL — ABNORMAL HIGH (ref 70–100)
Potassium: 5.4 mEq/L — ABNORMAL HIGH (ref 3.5–5.3)
Protein, Total: 6.4 g/dL (ref 6.0–8.3)
Sodium: 141 mEq/L (ref 135–145)
eGFR: 37.4 mL/min/{1.73_m2} — AB (ref >=60)

## 2022-08-16 LAB — LACTIC ACID: Lactic Acid: 1.1 mmol/L (ref 0.2–2.0)

## 2022-08-16 LAB — CBC AND DIFFERENTIAL
Absolute NRBC: 0 10*3/uL (ref 0.00–0.00)
Basophils Absolute Automated: 0.04 10*3/uL (ref 0.00–0.08)
Basophils Automated: 0.7 %
Eosinophils Absolute Automated: 0.11 10*3/uL (ref 0.00–0.44)
Eosinophils Automated: 1.8 %
Hematocrit: 32.3 % — ABNORMAL LOW (ref 34.7–43.7)
Hgb: 10 g/dL — ABNORMAL LOW (ref 11.4–14.8)
Immature Granulocytes Absolute: 0.02 10*3/uL (ref 0.00–0.07)
Immature Granulocytes: 0.3 %
Instrument Absolute Neutrophil Count: 2.7 10*3/uL (ref 1.10–6.33)
Lymphocytes Absolute Automated: 2.76 10*3/uL (ref 0.42–3.22)
Lymphocytes Automated: 45.1 %
MCH: 25.1 pg (ref 25.1–33.5)
MCHC: 31 g/dL — ABNORMAL LOW (ref 31.5–35.8)
MCV: 81 fL (ref 78.0–96.0)
MPV: 11.2 fL (ref 8.9–12.5)
Monocytes Absolute Automated: 0.49 10*3/uL (ref 0.21–0.85)
Monocytes: 8 %
Neutrophils Absolute: 2.7 10*3/uL (ref 1.10–6.33)
Neutrophils: 44.1 %
Nucleated RBC: 0 /100 WBC (ref 0.0–0.0)
Platelets: 242 10*3/uL (ref 142–346)
RBC: 3.99 10*6/uL (ref 3.90–5.10)
RDW: 14 % (ref 11–15)
WBC: 6.12 10*3/uL (ref 3.10–9.50)

## 2022-08-16 LAB — URINALYSIS WITH REFLEX TO MICROSCOPIC EXAM IF INDICATED
Bilirubin, UA: NEGATIVE
Blood, UA: NEGATIVE
Glucose, UA: NEGATIVE
Ketones UA: NEGATIVE
Leukocyte Esterase, UA: NEGATIVE
Nitrite, UA: NEGATIVE
Specific Gravity UA: 1.024 (ref 1.001–1.035)
Urine pH: 6.5 (ref 5.0–8.0)
Urobilinogen, UA: NORMAL mg/dL (ref 0.2–2.0)

## 2022-08-16 LAB — CREATINE KINASE (CK): Creatine Kinase (CK): 87 U/L (ref 29–233)

## 2022-08-16 LAB — WHOLE BLOOD GLUCOSE POCT: Whole Blood Glucose POCT: 234 mg/dL — ABNORMAL HIGH (ref 70–100)

## 2022-08-16 LAB — TSH: TSH: 1.4 u[IU]/mL (ref 0.35–4.94)

## 2022-08-16 LAB — MAGNESIUM: Magnesium: 1.7 mg/dL (ref 1.6–2.6)

## 2022-08-16 MED ORDER — BENZONATATE 100 MG PO CAPS
100.0000 mg | ORAL_CAPSULE | Freq: Three times a day (TID) | ORAL | Status: DC | PRN
Start: 2022-08-16 — End: 2022-08-18

## 2022-08-16 MED ORDER — GLUCAGON 1 MG IJ SOLR (WRAP)
1.0000 mg | INTRAMUSCULAR | Status: DC | PRN
Start: 2022-08-16 — End: 2022-08-18

## 2022-08-16 MED ORDER — CENOBAMATE 25 MG PO TABS
25.0000 mg | ORAL_TABLET | Freq: Every day | ORAL | Status: DC
Start: 2022-08-16 — End: 2022-08-17
  Administered 2022-08-17: 25 mg via ORAL

## 2022-08-16 MED ORDER — POTASSIUM CHLORIDE CRYS ER 20 MEQ PO TBCR
0.0000 meq | EXTENDED_RELEASE_TABLET | ORAL | Status: DC | PRN
Start: 2022-08-16 — End: 2022-08-18

## 2022-08-16 MED ORDER — INSULIN LISPRO 100 UNIT/ML SOLN (WRAP)
1.0000 [IU] | Freq: Every evening | Status: DC
Start: 2022-08-16 — End: 2022-08-18
  Administered 2022-08-16: 1 [IU] via SUBCUTANEOUS
  Administered 2022-08-17: 2 [IU] via SUBCUTANEOUS
  Filled 2022-08-16 (×2): qty 6
  Filled 2022-08-16: qty 3

## 2022-08-16 MED ORDER — SALINE SPRAY 0.65 % NA SOLN
2.0000 | NASAL | Status: DC | PRN
Start: 2022-08-16 — End: 2022-08-18

## 2022-08-16 MED ORDER — CARBOXYMETHYLCELLULOSE SODIUM 0.5 % OP SOLN
1.0000 [drp] | Freq: Three times a day (TID) | OPHTHALMIC | Status: DC | PRN
Start: 2022-08-16 — End: 2022-08-18

## 2022-08-16 MED ORDER — BENZOCAINE-MENTHOL MT LOZG (WRAP)
1.0000 | LOZENGE | OROMUCOSAL | Status: DC | PRN
Start: 2022-08-16 — End: 2022-08-18

## 2022-08-16 MED ORDER — INSULIN LISPRO 100 UNIT/ML SOLN (WRAP)
1.0000 [IU] | Freq: Three times a day (TID) | Status: DC
Start: 2022-08-17 — End: 2022-08-18
  Administered 2022-08-17 – 2022-08-18 (×3): 3 [IU] via SUBCUTANEOUS
  Filled 2022-08-16 (×3): qty 9

## 2022-08-16 MED ORDER — CARBAMAZEPINE ER 200 MG PO TB12
200.0000 mg | ORAL_TABLET | Freq: Two times a day (BID) | ORAL | Status: DC
Start: 2022-08-16 — End: 2022-08-17
  Administered 2022-08-16 – 2022-08-17 (×2): 200 mg via ORAL
  Filled 2022-08-16 (×3): qty 1

## 2022-08-16 MED ORDER — ATORVASTATIN CALCIUM 40 MG PO TABS
40.0000 mg | ORAL_TABLET | Freq: Every day | ORAL | Status: DC
Start: 2022-08-17 — End: 2022-08-18
  Administered 2022-08-17 – 2022-08-18 (×2): 40 mg via ORAL
  Filled 2022-08-16 (×2): qty 1

## 2022-08-16 MED ORDER — POTASSIUM CHLORIDE 10 MEQ/100ML IV SOLN
10.0000 meq | INTRAVENOUS | Status: DC | PRN
Start: 2022-08-16 — End: 2022-08-18

## 2022-08-16 MED ORDER — ONDANSETRON 4 MG PO TBDP
4.0000 mg | ORAL_TABLET | Freq: Four times a day (QID) | ORAL | Status: DC | PRN
Start: 2022-08-16 — End: 2022-08-18

## 2022-08-16 MED ORDER — ONDANSETRON HCL 4 MG/2ML IJ SOLN
4.0000 mg | Freq: Four times a day (QID) | INTRAMUSCULAR | Status: DC | PRN
Start: 2022-08-16 — End: 2022-08-18

## 2022-08-16 MED ORDER — EZETIMIBE 10 MG PO TABS
10.0000 mg | ORAL_TABLET | Freq: Every day | ORAL | Status: DC
Start: 2022-08-17 — End: 2022-08-18
  Administered 2022-08-17 – 2022-08-18 (×2): 10 mg via ORAL
  Filled 2022-08-16 (×2): qty 1

## 2022-08-16 MED ORDER — AMLODIPINE BESYLATE 2.5 MG PO TABS
2.5000 mg | ORAL_TABLET | Freq: Every day | ORAL | Status: DC
Start: 2022-08-17 — End: 2022-08-18
  Administered 2022-08-17 – 2022-08-18 (×2): 2.5 mg via ORAL
  Filled 2022-08-16 (×2): qty 1

## 2022-08-16 MED ORDER — MAGNESIUM SULFATE IN D5W 1-5 GM/100ML-% IV SOLN
1.0000 g | INTRAVENOUS | Status: DC | PRN
Start: 2022-08-16 — End: 2022-08-18

## 2022-08-16 MED ORDER — DEXTROSE 10 % IV BOLUS
12.5000 g | INTRAVENOUS | Status: DC | PRN
Start: 2022-08-16 — End: 2022-08-18

## 2022-08-16 MED ORDER — HEPARIN SODIUM (PORCINE) 5000 UNIT/ML IJ SOLN
5000.0000 [IU] | Freq: Three times a day (TID) | INTRAMUSCULAR | Status: DC
Start: 2022-08-16 — End: 2022-08-17
  Administered 2022-08-16 – 2022-08-17 (×3): 5000 [IU] via SUBCUTANEOUS
  Filled 2022-08-16 (×3): qty 1

## 2022-08-16 MED ORDER — FUROSEMIDE 20 MG PO TABS
20.0000 mg | ORAL_TABLET | Freq: Every day | ORAL | Status: DC
Start: 2022-08-17 — End: 2022-08-18
  Administered 2022-08-17 – 2022-08-18 (×2): 20 mg via ORAL
  Filled 2022-08-16 (×2): qty 1

## 2022-08-16 MED ORDER — DEXTROSE 50 % IV SOLN
12.5000 g | INTRAVENOUS | Status: DC | PRN
Start: 2022-08-16 — End: 2022-08-18

## 2022-08-16 MED ORDER — LEVETIRACETAM 500 MG PO TABS
1500.0000 mg | ORAL_TABLET | Freq: Two times a day (BID) | ORAL | Status: DC
Start: 2022-08-16 — End: 2022-08-18
  Administered 2022-08-16 – 2022-08-18 (×4): 1500 mg via ORAL
  Filled 2022-08-16 (×4): qty 3

## 2022-08-16 MED ORDER — GLUCOSE 40 % PO GEL (WRAP)
15.0000 g | ORAL | Status: DC | PRN
Start: 2022-08-16 — End: 2022-08-18

## 2022-08-16 MED ORDER — POTASSIUM & SODIUM PHOSPHATES 280-160-250 MG PO PACK
2.0000 | PACK | ORAL | Status: DC | PRN
Start: 2022-08-16 — End: 2022-08-18

## 2022-08-16 MED ORDER — MELATONIN 3 MG PO TABS
3.0000 mg | ORAL_TABLET | Freq: Every evening | ORAL | Status: DC | PRN
Start: 2022-08-16 — End: 2022-08-18

## 2022-08-16 MED ORDER — LORAZEPAM 2 MG/ML IJ SOLN
1.0000 mg | Freq: Once | INTRAMUSCULAR | Status: DC | PRN
Start: 2022-08-16 — End: 2022-08-18

## 2022-08-16 MED ORDER — NALOXONE HCL 0.4 MG/ML IJ SOLN (WRAP)
0.2000 mg | INTRAMUSCULAR | Status: DC | PRN
Start: 2022-08-16 — End: 2022-08-18

## 2022-08-16 MED ORDER — GABAPENTIN 300 MG PO CAPS
600.0000 mg | ORAL_CAPSULE | Freq: Two times a day (BID) | ORAL | Status: DC
Start: 2022-08-16 — End: 2022-08-18
  Administered 2022-08-16 – 2022-08-18 (×4): 600 mg via ORAL
  Filled 2022-08-16 (×4): qty 2

## 2022-08-16 MED ORDER — CARVEDILOL 12.5 MG PO TABS
12.5000 mg | ORAL_TABLET | Freq: Two times a day (BID) | ORAL | Status: DC
Start: 2022-08-16 — End: 2022-08-18
  Administered 2022-08-16 – 2022-08-18 (×4): 12.5 mg via ORAL
  Filled 2022-08-16 (×4): qty 1

## 2022-08-16 MED ORDER — ACETAMINOPHEN 325 MG PO TABS
650.0000 mg | ORAL_TABLET | Freq: Four times a day (QID) | ORAL | Status: DC | PRN
Start: 2022-08-16 — End: 2022-08-18

## 2022-08-16 NOTE — Telephone Encounter (Signed)
Dr.Gill's estb. patient is reporting seizure that happens everyday. Onset of symptoms: 3 days. Call was transferred to Anders Grant, RN.

## 2022-08-16 NOTE — Telephone Encounter (Signed)
Patient reports "non-stop" seizures for the last 3 days. She describes "petit-mal" seizures where her eyes roll to the back of her head and she has trouble speaking, episodes last 1-2 minutes, and occur 80 times per day for the last 3 days. Recommended that patient call EMS and be taken to ER for evaluation given her frequency and duration of seizures. Will route to provider for review and guidance.

## 2022-08-16 NOTE — Plan of Care (Signed)
 NURSING SHIFT NOTE     Patient: Tracey Sanchez  Day: 1      SHIFT EVENTS     Shift Narrative/Significant Events (PRN med administration, fall, RRT, etc.):     Alert and oriented x4. Denies sob, chest pain, dizziness, or headache.   Reported possible seizure presented with slurred speech at arrival that lasted less than 10 seconds. Scheduled seizure med given.     Four eyes skin assessment completed with Veronia Beets, tech  Skin intact.    Safety and fall precautions remain in place. Purposeful rounding completed.          ASSESSMENT     Changes in assessment from patient's baseline this shift:    Neuro: No  CV: No  Pulm: No  Peripheral Vascular: No  HEENT: No  GI: No  BM during shift: No, Last BM:    GU: No   Integ: No  MS: No        Mobility: PMP Activity: Step 6 - Walks in Room of Distance Walked (ft) (Step 6,7): 12 Feet           Lines     Patient Lines/Drains/Airways Status       Active Lines, Drains and Airways       Name Placement date Placement time Site Days    Peripheral IV 08/16/22 Anterior;Proximal;Right Forearm 08/16/22  1900  Forearm  less than 1                         VITAL SIGNS     Vitals:    08/17/22 0321   BP: 136/78   Pulse: 80   Resp: 17   Temp: 97.7 F (36.5 C)   SpO2: 99%       Temp  Min: 97.7 F (36.5 C)  Max: 97.7 F (36.5 C)  Pulse  Min: 75  Max: 81  Resp  Min: 17  Max: 17  BP  Min: 136/78  Max: 152/78  SpO2  Min: 99 %  Max: 100 %    No intake or output data in the 24 hours ending 08/17/22 0434             CARE PLAN       Problem: Neurological Deficit  Goal: Neurological status is stable or improving  Outcome: Progressing  Flowsheets (Taken 08/16/2022 2221)  Neurological status is stable or improving:  . Observe for seizure activity and initiate seizure precautions if indicated  . Perform CAM Assessment

## 2022-08-16 NOTE — H&P (Signed)
[facility]      Patient: Tracey Sanchez  Date: 08/16/2022   DOB: [birth year]  Date of Admission: 08/16/2022   MRN: [id number]  Attending: Candice Camp, DNP NP         No chief complaint on file.       History Gathered From: ***    HISTORY AND PHYSICAL     Tracey Sanchez is a 72 y.o. female with a PMHx of *** who presented with ***.    Past Medical History:   Diagnosis Date    Chronic kidney disease     Convulsions     Diabetes mellitus     Hypertension     Lung disease     Sleep apnea        Past Surgical History:   Procedure Laterality Date    CHOLECYSTECTOMY      KNEE ARTHROSCOPY Left     REVISION, CARPAL TUNNEL Left        Prior to Admission medications    Medication Sig Start Date End Date Taking? Authorizing Provider   amLODIPine (NORVASC) 2.5 MG tablet Take 1 tablet (2.5 mg) by mouth daily   Yes [provider]   atorvastatin (LIPITOR) 40 MG tablet Take 1 tablet (40 mg) by mouth daily   Yes [provider]   carBAMazepine (CARBATROL) 200 MG 12 hr capsule Take 1 capsule (200 mg) by mouth 2 (two) times daily   Yes [provider]   carvedilol (COREG) 12.5 MG tablet Take 1 tablet (12.5 mg) by mouth 2 (two) times daily with meals   Yes [provider]   Cenobamate (Xcopri) 14 x 150 MG & 14 x200 MG Tablet Therapy Pack Take 1 tablet by mouth nightly 08/09/22  Yes Resa Miner, MD   ezetimibe (ZETIA) 10 MG tablet Take 1 tablet (10 mg) by mouth daily   Yes [provider]   furosemide (LASIX) 20 MG tablet Take 1 tablet (20 mg) by mouth daily   Yes [provider]   gabapentin (NEURONTIN) 600 MG tablet Take 1 tablet (600 mg) by mouth 2 (two) times daily   Yes [provider]   Insulin Degludec (TRESIBA SC) Inject 25 Units into the skin daily   Yes [provider]   levETIRAcetam (KEPPRA) 750 MG tablet Take 2 tablets (1,500 mg) by mouth 2 (two) times daily   Yes [provider]   Insulin Zinc Human (NOVOLIN L SC) Inject 15 Unit into  the skin PT injects 15 units every morning    [provider]   Insulin Zinc Human (NOVOLIN L SC) Inject 10 Unit into the skin Pt injects 10 units every night    [provider]   sodium bicarbonate 650 MG tablet Take 1 tablet (650 mg) by mouth 4 (four) times daily    [provider]   Cenobamate (Xcopri) 14 x 50 MG & 14 x100 MG Tablet Therapy Pack Take 1 tablet by mouth nightly 08/09/22 08/16/22  Resa Miner, MD       Allergies   Allergen Reactions    Dilantin [Phenytoin]     Codeine Other (See Comments) and Anxiety     Other reaction(s): Other (see comments), other/intolerance         Family History   Problem Relation Age of Onset    Breast cancer Maternal Aunt        Social History     Tobacco Use  Smoking status: Never    Smokeless tobacco: Never   Vaping Use    Vaping Use: Never used   Substance Use Topics    Alcohol use: Yes     Comment: glass of wine occ    Drug use: Never       REVIEW OF SYSTEMS   Positive for: ***  Negative for: ***  All ROS completed and otherwise negative.    PHYSICAL EXAM     Vital Signs (most recent): BP 152/78   Pulse 75   Temp 97.7 F (36.5 C) (Oral)   Resp 17   SpO2 100%   Constitutional: {Appearance:123945}. Patient speaks freely in full sentences.   HEENT: NC/AT, PERRL, no scleral icterus or conjunctival pallor, no nasal discharge, MMM, oropharynx without erythema or exudate  Neck: trachea midline, supple, no cervical or supraclavicular lymphadenopathy or masses  Cardiovascular: RRR, normal S1 S2, no murmurs, gallops, palpable thrills, no JVD, Non-displaced PMI.  Respiratory: Normal rate. No retractions or increased work of breathing. Clear to auscultation and percussion bilaterally.  Gastrointestinal: +BS, non-distended, soft, non-tender, no rebound or guarding, no hepatosplenomegaly  Genitourinary: no suprapubic or costovertebral angle tenderness  Musculoskeletal: ROM and motor strength grossly normal. No clubbing, edema, or cyanosis. DP and  radial pulses 2+ and symmetric.  Neurologic: EOMI, CN 2-12 grossly intact. no gross motor or sensory deficits  Psychiatric: AAOx3, affect and mood appropriate. The patient is alert, interactive, appropriate.    LABS & IMAGING     No results found for this or any previous visit (from the past 24 hour(s)).    MICROBIOLOGY:  Blood Culture:***  Urine Culture: ***  Antibiotics Started: ***    IMAGING:  Upon my review: ***    CARDIAC:  EKG Interpretation (upon my review):  ***    Markers:        EMERGENCY DEPARTMENT COURSE:  Orders Placed This Encounter   Procedures    Diet NPO effective now    Vital signs    Pulse Oximetry    Progressive Mobility Protocol    Notify physician    I/O    Height    Weight    Skin assessment    Notify physician (Critical Blood Glucose Value)    Notify physician (Communication: Document Abnormal Blood Glucose)    POCT order (PRN hypoglycemia)    Adult Hypoglycemia Treatment Algorithm    Place sequential compression device    Maintain sequential compression device    Education: Activity    Education: Disease Process & Condition    Education: Pain Management    Education: Falls Risk    Education: Smoking Cessation    No antipyretics    Full Code    Saline lock IV    ADMIT TO INPATIENT       ASSESSMENT & PLAN     Tracey Sanchez is a 72 y.o. female admitted with Seizures.    Patient Active Hospital Problem List:       Seizures (08/16/2022)           Assessment: ***        Plan: ***           {Vanishing Tip  Patient meets BMI criteria for a weight-based diagnosis. Patient has high (BMI=There is no height or weight on file to calculate BMI.)      Overweight = BMI 25-29.9 kg/m^2     Obesity = BMI 30-39.9 kg/m^2     Obesity Class 3 (formerly known as Morbid  Obesity) = BMI 40+ kg/m^2  reference. [id number]  Patient has BMI=There is no height or weight on file to calculate BMI.  Diagnosis: {BMI High:57938} based on BMI criteria        Nutrition: ***    DVT/VTE Prophylaxis:   Current  Facility-Administered Medications (Includes Only Anticoagulants, Misc. Hematological)   Medication Dose Route Last Admin    heparin (porcine) injection 5,000 Units  5,000 Units Subcutaneous         Code Status: Full Code    Patient Class: {PATIENTCLASS:64090}    Anticipated medical stability for discharge: {Estimated Discharge:64262}      Signed,  Candice Camp, DNP NP    08/16/2022 8:23 PM  Time Elapsed: ***

## 2022-08-17 ENCOUNTER — Encounter: Payer: Self-pay | Admitting: Internal Medicine

## 2022-08-17 DIAGNOSIS — Z008 Encounter for other general examination: Secondary | ICD-10-CM

## 2022-08-17 LAB — BASIC METABOLIC PANEL
Anion Gap: 4 — ABNORMAL LOW (ref 5.0–15.0)
BUN: 23 mg/dL — ABNORMAL HIGH (ref 7.0–21.0)
CO2: 23 mEq/L (ref 17–29)
Calcium: 8.2 mg/dL — ABNORMAL LOW (ref 8.5–10.5)
Chloride: 112 mEq/L — ABNORMAL HIGH (ref 99–111)
Creatinine: 1.3 mg/dL — ABNORMAL HIGH (ref 0.4–1.0)
Glucose: 184 mg/dL — ABNORMAL HIGH (ref 70–100)
Potassium: 5.1 mEq/L (ref 3.5–5.3)
Sodium: 139 mEq/L (ref 135–145)
eGFR: 44.4 mL/min/{1.73_m2} — AB (ref >=60)

## 2022-08-17 LAB — CBC AND DIFFERENTIAL
Absolute NRBC: 0 10*3/uL (ref 0.00–0.00)
Basophils Absolute Automated: 0.03 10*3/uL (ref 0.00–0.08)
Basophils Automated: 0.4 %
Eosinophils Absolute Automated: 0.11 10*3/uL (ref 0.00–0.44)
Eosinophils Automated: 1.6 %
Hematocrit: 32 % — ABNORMAL LOW (ref 34.7–43.7)
Hgb: 9.8 g/dL — ABNORMAL LOW (ref 11.4–14.8)
Immature Granulocytes Absolute: 0.02 10*3/uL (ref 0.00–0.07)
Immature Granulocytes: 0.3 %
Instrument Absolute Neutrophil Count: 3.03 10*3/uL (ref 1.10–6.33)
Lymphocytes Absolute Automated: 3.26 10*3/uL — ABNORMAL HIGH (ref 0.42–3.22)
Lymphocytes Automated: 46.9 %
MCH: 24.8 pg — ABNORMAL LOW (ref 25.1–33.5)
MCHC: 30.6 g/dL — ABNORMAL LOW (ref 31.5–35.8)
MCV: 81 fL (ref 78.0–96.0)
MPV: 11.3 fL (ref 8.9–12.5)
Monocytes Absolute Automated: 0.5 10*3/uL (ref 0.21–0.85)
Monocytes: 7.2 %
Neutrophils Absolute: 3.03 10*3/uL (ref 1.10–6.33)
Neutrophils: 43.6 %
Nucleated RBC: 0 /100 WBC (ref 0.0–0.0)
Platelets: 237 10*3/uL (ref 142–346)
RBC: 3.95 10*6/uL (ref 3.90–5.10)
RDW: 14 % (ref 11–15)
WBC: 6.95 10*3/uL (ref 3.10–9.50)

## 2022-08-17 LAB — WHOLE BLOOD GLUCOSE POCT
Whole Blood Glucose POCT: 151 mg/dL — ABNORMAL HIGH (ref 70–100)
Whole Blood Glucose POCT: 260 mg/dL — ABNORMAL HIGH (ref 70–100)
Whole Blood Glucose POCT: 173 mg/dL — ABNORMAL HIGH (ref 70–100)
Whole Blood Glucose POCT: 285 mg/dL — ABNORMAL HIGH (ref 70–100)

## 2022-08-17 LAB — HEMOGLOBIN A1C
Average Estimated Glucose: 246 mg/dL
Hemoglobin A1C: 10.2 % — ABNORMAL HIGH (ref 4.6–5.6)

## 2022-08-17 LAB — HIGH SENSITIVITY TROPONIN-I: hs Troponin-I: 3.2 ng/L

## 2022-08-17 MED ORDER — CENOBAMATE 25 MG PO TABS
25.0000 mg | ORAL_TABLET | Freq: Every day | ORAL | Status: AC
Start: 2022-08-17 — End: 2022-08-17
  Administered 2022-08-17: 25 mg via ORAL

## 2022-08-17 MED ORDER — INSULIN GLARGINE 100 UNIT/ML SC SOLN
25.0000 [IU] | Freq: Every morning | SUBCUTANEOUS | Status: DC
Start: 2022-08-17 — End: 2022-08-18
  Administered 2022-08-17 – 2022-08-18 (×2): 25 [IU] via SUBCUTANEOUS
  Filled 2022-08-17 (×2): qty 25

## 2022-08-17 MED ORDER — ENOXAPARIN SODIUM 40 MG/0.4ML IJ SOSY
40.0000 mg | PREFILLED_SYRINGE | Freq: Every day | INTRAMUSCULAR | Status: DC
Start: 2022-08-18 — End: 2022-08-18
  Administered 2022-08-18: 40 mg via SUBCUTANEOUS
  Filled 2022-08-17: qty 0.4

## 2022-08-17 MED ORDER — CENOBAMATE 25 MG PO TABS
50.0000 mg | ORAL_TABLET | Freq: Every day | ORAL | Status: DC
Start: 2022-08-18 — End: 2022-08-18
  Administered 2022-08-18: 50 mg via ORAL

## 2022-08-17 MED ORDER — ASPIRIN 81 MG PO CHEW
81.0000 mg | CHEWABLE_TABLET | Freq: Every day | ORAL | Status: DC
Start: 2022-08-17 — End: 2022-08-18
  Administered 2022-08-17 – 2022-08-18 (×2): 81 mg via ORAL
  Filled 2022-08-17 (×2): qty 1

## 2022-08-17 NOTE — Progress Notes (Deleted)
Preliminary EEG Report    Marked attenuation over left hemisphere concerning for cortical injury, intervening fluid collection or post ictal state.  No seizures or epileptiform discharges.    Full note to follow    Feliberto Gottron, MD

## 2022-08-17 NOTE — Consults (Signed)
[facility] Consultation Note                                       Date Time: 08/17/22 2:42 PM  Patient Name: Tracey Sanchez, Tracey Sanchez  Requesting Physician: Jerelyn Scott, MD  Date of Admission: 08/16/2022    CC / Reason for Consultation: Seizure     Assessment:   Pt is a 72yo F w/ hx of seizure disorder on Keppra and Xcopri, CKD, HTN, HLD, DM who was transferred from OSH for recurrent seizure like activity. Neuro exam w/ b/l UE 4/5 and increased tone in b/l LE. Will start cEEG to further assess episodes of speech arrest and eyes rolling back. However, exam also suggestive of myelopathy so would recommend ruling out C spine stenosis.     Plan:   -cEEG  -MRI C spine w/o contrast  -Continue Keppra 1.5g bid  -Continue gabapentin 663m bid  -Continue Xcopri (currently on 532mdaily)  -Seizure precautions    Discussed w/ pt and pt's family. Discussed w/ Dr. NgAlfonse Spruce  HPI   Tracey DISILVESTROs a [age].o. female w/ hx of seizure disorder on Keppra and Xcopri, CKD, HTN, HLD, DM who was transferred from OSH for recurrent seizure like activity. She has had seizures since age [age]Initially seizures were focal but over the past 5 years, pt has also had GTC's. Pt c/o episodes of eyes rolling back, speech changes (stuttering, difficulty getting words out) lasting <42m75m Pt is aware during these episodes. Pt was having multiple episodes per day over the past few days but they seemed to have slowed down after getting Ativan in the ED yesterday. She also has started to get whole body jerks causing her to drop objects. C/o b/l hand weakness.     Currently on Keppra 1.5g bid, titrating up on Xcopri and gabapentin 600m69md      Prior ASMs: PHB, VPA, PHT, LEV, CBZ - bad reaction, very sick/nauseous/vomiting/dizzy, PER  *not TPM or ZNS, CLB, LCM    Past Medical Hx     Past Medical History:   Diagnosis Date    Chronic kidney disease     Convulsions     Diabetes mellitus     Hypertension     Lung disease     Sleep apnea          Past Surgical Hx:     Past Surgical History:   Procedure Laterality Date    CHOLECYSTECTOMY      KNEE ARTHROSCOPY Left     REVISION, CARPAL TUNNEL Left     SHOULDER SURGERY          Family Medical History:      Family History   Problem Relation Age of Onset    Heart disease Mother     Heart disease Father     Seizures Father     Seizures Sister     Breast cancer Maternal Aunt        Social Hx     Social History     Socioeconomic History    Marital status: Widowed   Tobacco Use    Smoking status: Never    Smokeless tobacco: Never   Vaping Use    Vaping Use: Never used   Substance and Sexual Activity    Alcohol use: Yes     Comment: glass of wine occ  Drug use: Never     Social Determinants of Health     Financial Resource Strain: Medium Risk (08/09/2022)    Overall Financial Resource Strain (CARDIA)     Difficulty of Paying Living Expenses: Somewhat hard   Food Insecurity: Food Insecurity Present (08/16/2022)    Hunger Vital Sign     Worried About Running Out of Food in the Last Year: Never true     [place] in the Last Year: Sometimes true   Transportation Needs: Unmet Transportation Needs (08/16/2022)    PRAPARE - Armed forces logistics/support/administrative officer (Medical): Yes     Lack of Transportation (Non-Medical): Yes   Physical Activity: Inactive (08/09/2022)    Exercise Vital Sign     Days of Exercise per Week: 0 days     Minutes of Exercise per Session: 0 min   Stress: Stress Concern Present (08/09/2022)    [place]     Feeling of Stress : Very much   Social Connections: Moderately Integrated (08/09/2022)    Social Connection and Isolation Panel [NHANES]     Frequency of Communication with Friends and Family: More than three times a week     Frequency of Social Gatherings with Friends and Family: Twice a week     Attends Religious Services: More than 4 times per year     Active Member of [employer] or Organizations: Yes     Attends Theatre manager Meetings: 1 to 4 times per year     Marital Status: Widowed   Intimate Partner Violence: Not At Risk (08/16/2022)    Humiliation, Afraid, Rape, and Kick questionnaire     Fear of Current or Ex-Partner: No     Emotionally Abused: No     Physically Abused: No     Sexually Abused: No   Housing Stability: Low Risk  (08/16/2022)    Housing Stability Vital Sign     Unable to Pay for Housing in the Last Year: No     Number of [place] in the Last Year: 1     Unstable Housing in the Last Year: No       Meds     Home :   Prior to Admission medications    Medication Sig Start Date End Date Taking? Authorizing Provider   amLODIPine (NORVASC) 2.5 MG tablet Take 1 tablet (2.5 mg) by mouth daily   Yes [provider]   atorvastatin (LIPITOR) 40 MG tablet Take 1 tablet (40 mg) by mouth daily   Yes [provider]   carBAMazepine (CARBATROL) 200 MG 12 hr capsule Take 1 capsule (200 mg) by mouth 2 (two) times daily   Yes [provider]   carvedilol (COREG) 12.5 MG tablet Take 1 tablet (12.5 mg) by mouth 2 (two) times daily with meals   Yes [provider]   Cenobamate (Xcopri) 14 x 150 MG & 14 x200 MG Tablet Therapy Pack Take 1 tablet by mouth nightly 08/09/22  Yes Resa Miner, MD   ezetimibe (ZETIA) 10 MG tablet Take 1 tablet (10 mg) by mouth daily   Yes [provider]   furosemide (LASIX) 20 MG tablet Take 1 tablet (20 mg) by mouth daily   Yes [provider]   gabapentin (NEURONTIN) 600 MG tablet Take 1 tablet (600 mg) by mouth 2 (two) times daily   Yes [provider]   Insulin Degludec (TRESIBA  SC) Inject 25 Units into the skin daily   Yes [provider]   levETIRAcetam (KEPPRA) 750 MG tablet Take 2 tablets (1,500 mg) by mouth 2 (two) times daily   Yes [provider]   Insulin Zinc Human (NOVOLIN L SC) Inject 15 Unit into the skin PT injects 15 units every morning    [provider]   Insulin Zinc Human (NOVOLIN L SC)  Inject 10 Unit into the skin Pt injects 10 units every night    [provider]   sodium bicarbonate 650 MG tablet Take 1 tablet (650 mg) by mouth 4 (four) times daily    [provider]      Inpatient :   Current Facility-Administered Medications   Medication Dose Route Frequency    amLODIPine  2.5 mg Oral Daily    aspirin  81 mg Oral Daily    atorvastatin  40 mg Oral Daily    carvedilol  12.5 mg Oral Q12H [place]    ezetimibe  10 mg Oral Daily    furosemide  20 mg Oral Daily    gabapentin  600 mg Oral Q12H [place]    heparin (porcine)  5,000 Units Subcutaneous Q8H [place]    insulin glargine  25 Units Subcutaneous QAM    insulin lispro  1-3 Units Subcutaneous QHS    insulin lispro  1-5 Units Subcutaneous TID AC    levETIRAcetam  1,500 mg Oral Q12H [place]    cenobamate  25 mg Oral Daily    [START ON 08/18/2022] cenobamate  50 mg Oral Daily         Allergies    Dilantin [phenytoin] and Codeine      Review of Systems   All other systems were reviewed and are negative except for that mentioned in the HPI    Physical Exam:   Temp:  [97.7 F (36.5 C)-98.1 F (36.7 C)] 98.1 F (36.7 C)  Heart Rate:  [74-81] 75  Resp Rate:  [16-17] 16  BP: (112-153)/(61-81) 132/79     Vital Signs:  Reviewed    General: NAD. Cooperative with the exam  Neck: supple  CVS: warm and well perfused  Resp: no respiratory distress  Extremities: no pedal edema, no rashes noted    Mental Status: Awake, alert and oriented x3. Speech is fluent and non dysarthric. Able to follow commands.   Cranial nerves: PERRL, EOMI, VFF to FC, sensation intact V1-V3, face symmetric, hearing intact to voice, palate rise symmetric, tongue protrudes midline  Motor: No pronator drift. 4/5 b/l UE and B/l LE 5/5. Increased tone b/l LE  Sensory: Light touch intact throughout  Reflexes: 3+ b/l UE  Coordination: FTN and HKS intact  Gait: Deferred     Labs:     Results       Procedure Component Value Units Date/Time    Glucose Whole Blood - POCT [id number]  (Abnormal)  Collected: 08/17/22 1155     Updated: 08/17/22 1212     Whole Blood Glucose POCT 260 mg/dL     Glucose Whole Blood - POCT [id number]  (Abnormal) Collected: 08/17/22 0800     Updated: 08/17/22 0803     Whole Blood Glucose POCT 151 mg/dL     Basic Metabolic Panel [id number]  (Abnormal) Collected: 08/17/22 0324    Specimen: Blood Updated: 08/17/22 0359     Glucose 184 mg/dL      BUN 23.0 mg/dL      Creatinine 1.3 mg/dL  Calcium 8.2 mg/dL      Sodium 139 mEq/L      Potassium 5.1 mEq/L      Chloride 112 mEq/L      CO2 23 mEq/L      Anion Gap 4.0     eGFR 44.4 mL/min/1.73 m2     CBC and differential [id number]  (Abnormal) Collected: 08/17/22 0324    Specimen: Blood Updated: 08/17/22 0342     WBC 6.95 x10 3/uL      Hgb 9.8 g/dL      Hematocrit 32.0 %      Platelets 237 x10 3/uL      RBC 3.95 x10 6/uL      MCV 81.0 fL      MCH 24.8 pg      MCHC 30.6 g/dL      RDW 14 %      MPV 11.3 fL      Instrument Absolute Neutrophil Count 3.03 x10 3/uL      Neutrophils 43.6 %      Lymphocytes Automated 46.9 %      Monocytes 7.2 %      Eosinophils Automated 1.6 %      Basophils Automated 0.4 %      Immature Granulocytes 0.3 %      Nucleated RBC 0.0 /100 WBC      Neutrophils Absolute 3.03 x10 3/uL      Lymphocytes Absolute Automated 3.26 x10 3/uL      Monocytes Absolute Automated 0.50 x10 3/uL      Eosinophils Absolute Automated 0.11 x10 3/uL      Basophils Absolute Automated 0.03 x10 3/uL      Immature Granulocytes Absolute 0.02 x10 3/uL      Absolute NRBC 0.00 x10 3/uL     Hemoglobin A1C [id number]  (Abnormal) Collected: 08/16/22 2049    Specimen: Blood Updated: 08/17/22 0316     Hemoglobin A1C 10.2 %      Average Estimated Glucose 246.0 mg/dL     High Sensitivity Troponin-I [id number] Collected: 08/16/22 2049    Specimen: Blood Updated: 08/17/22 0234     hs Troponin-I 3.2 ng/L     TSH [id number] Collected: 08/16/22 2049    Specimen: Blood Updated: 08/16/22 2152     TSH 1.40 uIU/mL     Comprehensive metabolic panel [id number]   (Abnormal) Collected: 08/16/22 2049    Specimen: Blood Updated: 08/16/22 2136     Glucose 259 mg/dL      BUN 22.0 mg/dL      Creatinine 1.5 mg/dL      Sodium 141 mEq/L      Potassium 5.4 mEq/L      Chloride 111 mEq/L      CO2 23 mEq/L      Calcium 8.4 mg/dL      Protein, Total 6.4 g/dL      Albumin 3.1 g/dL      AST (SGOT) 14 U/L      ALT 15 U/L      Alkaline Phosphatase 106 U/L      Bilirubin, Total 0.2 mg/dL      Globulin 3.3 g/dL      Albumin/Globulin Ratio 0.9     Anion Gap 7.0     eGFR 37.4 mL/min/1.73 m2     Creatine Kinase (CK) [id number] Collected: 08/16/22 2049    Specimen: Blood Updated: 08/16/22 2136     Creatine Kinase (CK) 87 U/L     Magnesium [id number] Collected: 08/16/22 2049  Specimen: Blood Updated: 08/16/22 2136     Magnesium 1.7 mg/dL     Urinalysis Reflex to Microscopic Exam [id number]  (Abnormal) Collected: 08/16/22 2054    Specimen: Urine Updated: 08/16/22 2116     Urine Type Clean Catch     Color, UA Straw     Clarity, UA Clear     Specific Gravity UA 1.024     Urine pH 6.5     Leukocyte Esterase, UA Negative     Nitrite, UA Negative     Protein, UR 70= 1+     Glucose, UA Negative     Ketones UA Negative     Urobilinogen, UA Normal mg/dL      Bilirubin, UA Negative     Blood, UA Negative     RBC, UA 3-5 /hpf      WBC, UA 0-5 /hpf      Squamous Epithelial Cells, Urine 6-10 /hpf     Lactic Acid [id number] Collected: 08/16/22 2049    Specimen: Blood Updated: 08/16/22 2112     Lactic Acid 1.1 mmol/L     CBC and differential [id number]  (Abnormal) Collected: 08/16/22 2049    Specimen: Blood Updated: 08/16/22 2104     WBC 6.12 x10 3/uL      Hgb 10.0 g/dL      Hematocrit 32.3 %      Platelets 242 x10 3/uL      RBC 3.99 x10 6/uL      MCV 81.0 fL      MCH 25.1 pg      MCHC 31.0 g/dL      RDW 14 %      MPV 11.2 fL      Instrument Absolute Neutrophil Count 2.70 x10 3/uL      Neutrophils 44.1 %      Lymphocytes Automated 45.1 %      Monocytes 8.0 %      Eosinophils Automated 1.8 %      Basophils  Automated 0.7 %      Immature Granulocytes 0.3 %      Nucleated RBC 0.0 /100 WBC      Neutrophils Absolute 2.70 x10 3/uL      Lymphocytes Absolute Automated 2.76 x10 3/uL      Monocytes Absolute Automated 0.49 x10 3/uL      Eosinophils Absolute Automated 0.11 x10 3/uL      Basophils Absolute Automated 0.04 x10 3/uL      Immature Granulocytes Absolute 0.02 x10 3/uL      Absolute NRBC 0.00 x10 3/uL     Glucose Whole Blood - POCT [id number]  (Abnormal) Collected: 08/16/22 2042     Updated: 08/16/22 2057     Whole Blood Glucose POCT 234 mg/dL             Rads:   No results found for this or any previous visit.      Nora Rooke (Kimi) Helane Rima, MD  [facility]  Epic ChatMertha Baars [facility]  Spectra [phone]

## 2022-08-17 NOTE — Procedures (Signed)
NEURODIAGNOSTIC LABORATORY  LONG TERM ELECTROENCEPHALOGRAM MONITORING WITH VIDEO       CVEEG   Date/Time:  [id number] 08/17/2022 -  0600  08/18/2022  Patient Name: Tracey Sanchez, Tracey Sanchez  DOB: 04-05-1951  [id number]  MRN: [id number]      IMPRESSION:  Normal awake and asleep background.  Rare generalized spike and wake interictal discharges.  No seizures.      "The following report will be updated at intervals. If there are any 'spells', please push the event button.For stat results:  the tech should call the reading neurologist on cell ".      INDICATION:   A long-term video EEG monitoring was recommended in this 72 y.o. year-old patient  with h/o epilepsy with recurrent seizures to evaluate any underlying non-convulsive status or seizures.     CNS MEDS:  Gabapentin 626m q12h  LEV [id number]  Xcopri 5023mD    DESCRIPTION:  The posterior dominant rhythm is 9Hz$ . The background is continuous, reactive, symmetric and consists of 8-10 Hz alpha activity which attenuates with eye opening.    Rare generalized high voltage 2-3 Hz spike and wave discharges likely represent interictal discharges.     No seizures .    Focal abnormality:no  State changes:sleep II  Push button events: 1630. No sz  Breech: no  Artifact:emg  EKG: regular      Recording Technique / Montage :    This electroencephalogram was recorded using both referential and differential montages. Using a digital machine the 18 channel International 10-20 System of electrode placement was used    Digital Analysis Methodology: (Persyst software 14 )  is available for the study: No  ?     EEG snapshot        Interictal discharge            AsmFeliberto GottronD    Board Certified,Neurology  Board Certified, Neurophysiology-Critical Care EEG      This note was generated by the Epic EMR system/ Dragon speech recognition and may contain inherent errors or omissions not intended by the user. Grammatical errors, random word insertions, deletions, pronoun errors and incomplete sentences  are occasional consequences of this technology due to software limitations. Not all errors are caught or corrected. If there are questions or concerns about the content of this note or information contained within the body of this dictation they should be addressed directly with the author for clarification

## 2022-08-17 NOTE — Nursing Progress Note (Signed)
NURSING SHIFT NOTE     Patient: Tracey Sanchez  Day: 1      SHIFT EVENTS     Shift Narrative/Significant Events (PRN med administration, fall, RRT, etc.):     Patient alert and oriented. Medicated as due with no adverse reaction. External catheter placed due to patient having EEG at bedside. Assisted with cares as needed and kept comfortable. LVS stable    Safety and fall precautions remain in place. Purposeful rounding completed.          ASSESSMENT     Changes in assessment from patient's baseline this shift:    Neuro: No  CV: No  Pulm: No  Peripheral Vascular: No  HEENT: No  GI: No  BM during shift: No, Last BM:    GU: No   Integ: No  MS: No    Pain: None  Pain Interventions: None  Medications Utilized: None.    Mobility: PMP Activity: Step 6 - Walks in Room of Distance Walked (ft) (Step 6,7):  (12 feet)           Lines     Patient Lines/Drains/Airways Status       Active Lines, Drains and Airways       Name Placement date Placement time Site Days    Peripheral IV 08/16/22 Anterior;Proximal;Right Forearm 08/16/22  1900  Forearm  1                         VITAL SIGNS     Vitals:    08/17/22 1940   BP: 109/68   Pulse: 85   Resp: 17   Temp: 98.1 F (36.7 C)   SpO2: 100%       Temp  Min: 97.7 F (36.5 C)  Max: 98.8 F (37.1 C)  Pulse  Min: 74  Max: 85  Resp  Min: 16  Max: 18  BP  Min: 109/68  Max: 153/81  SpO2  Min: 97 %  Max: 100 %      Intake/Output Summary (Last 24 hours) at 08/17/2022 2032  Last data filed at 08/17/2022 1000  Gross per 24 hour   Intake 300 ml   Output --   Net 300 ml             Symptoms:     Amount achieved on incentive spirometer?: No data recorded      CARE PLAN

## 2022-08-17 NOTE — Progress Notes (Signed)
Preliminary EEG Report    mild encephalopathy  No seizures or epileptiform discharges.    Full note to follow    Feliberto Gottron, MD

## 2022-08-17 NOTE — Progress Notes (Signed)
[facility]  PROGRESS NOTE      Patient: Tracey Sanchez  Date: 08/17/2022   LOS: 1 Days  Admission Date: 08/16/2022   MRN: [id number]  Attending: Jerelyn Scott, MD  When on service as the attending, please contact me on [place] from 7 AM - 7 PM for non-urgent issues. For urgent matters use XTend page from 7 AM - 7 PM.       ASSESSMENT/PLAN     Tracey Sanchez is a 72 y.o. female with history of seizure disorder, HTN, HLD, type 2 DM on insulin admitted with Seizures    Breakthrough seizure in the setting of   Known seizure disorder  Patient presenting with frequent daily seizures over the past 1 month.  Typical seizure described as eyes rolling back, stuttering speech, during which time patient is aware of seizure activity and is able to tell when an episode is coming on.  Presented to outside hospital for evaluation of seizure and transferred to [facility] for continuous EEG.  - Start continuous EEG  - Continue home Keppra, carbamazepine, Xcopri  - Neurology consulted, follow-up recommendations  - IV Ativan as needed for prolonged seizure activity     Reported EKG changes  EKG here with NSR.  High-sensitivity troponin negative.  No chest pain.  - Continue to monitor     HTN  HLD   - resume amlodipine, furosemide   - resume atorvastatin     DMII  A1c 10.2.  - glargine in place of home tresiba 25 units daily   - SSI   - ADA diet     CKD III  Creatinine at baseline, per chart review.  - Continue to monitor BMP     Nutrition: Diabetic/cardiac diet          Patient has BMI=Body mass index is 33.59 kg/m.  Diagnosis: Obesity based on BMI criteria          Recent Labs     08/17/22  0324 08/16/22  2049   Potassium 5.1 5.4*     Diagnosis: Hyperkalemia        Recent Labs   Lab 08/17/22  0324 08/16/22  2049   Hgb 9.8* 10.0*   Hematocrit 32.0* 32.3*   MCV 81.0 81.0   WBC 6.95 6.12   Platelets 237 242         Anemia Diagnosis: Unspecified Anemia (currently unable to determine type)    Code Status: Full Code    Dispo:  Anticipate 24 to 48 hours inpatient depending on results of EEG, need for titration of antiepileptic medications    Family Contact: daughter    DVT Prophylaxis:   Current Facility-Administered Medications (Includes Only Anticoagulants, Misc. Hematological)   Medication Dose Route Last Admin    [START ON 08/18/2022] enoxaparin (LOVENOX) syringe 40 mg  40 mg Subcutaneous            CHART  REVIEW & DISCUSSION     The following chart items were reviewed as of 2:58 PM on 08/17/22:  [x]$  Lab Results [x]$  Imaging Results   [x]$  Problem List  [x]$  Current Orders [x]$  Current Medications  []$  Allergies  []$  Code Status [x]$  Previous Notes   []$  SDoH    The management and plan of care for this patient was discussed with the following specialty consultants:  []$  Cardiology  []$  Gastroenterology                 []$  Infectious Disease  []$   Pulmonology [x]$  Neurology                []$  Nephrology  []$  Neurosurgery []$  Orthopedic Surgery  []$  Heme/Onc  []$  General Surgery []$  Psychiatry                                   []$  Palliative    SUBJECTIVE     Tracey Sanchez reports feeling well overall. We discussed plan of care, including start of EEG and change in AEDs if needed.     MEDICATIONS     Current Facility-Administered Medications   Medication Dose Route Frequency    amLODIPine  2.5 mg Oral Daily    aspirin  81 mg Oral Daily    atorvastatin  40 mg Oral Daily    carvedilol  12.5 mg Oral Q12H [place]    ezetimibe  10 mg Oral Daily    furosemide  20 mg Oral Daily    gabapentin  600 mg Oral Q12H [place]    heparin (porcine)  5,000 Units Subcutaneous Q8H [place]    insulin glargine  25 Units Subcutaneous QAM    insulin lispro  1-3 Units Subcutaneous QHS    insulin lispro  1-5 Units Subcutaneous TID AC    levETIRAcetam  1,500 mg Oral Q12H [place]    [START ON 08/18/2022] cenobamate  50 mg Oral Daily       PHYSICAL EXAM     Vitals:    08/17/22 1200   BP: 132/79   Pulse: 75   Resp: 16   Temp: 98.1 F (36.7 C)   SpO2: 99%     Temperature: Temp  Min: 97.7 F (36.5 C)   Max: 98.1 F (36.7 C)  Pulse: Pulse  Min: 74  Max: 81  Respiratory: Resp  Min: 16  Max: 17  Non-Invasive BP: BP  Min: 112/61  Max: 153/81  Pulse Oximetry SpO2  Min: 97 %  Max: 100 %    Intake and Output Summary (Last 24 hours) at Date Time  No intake or output data in the 24 hours ending 08/17/22 1458    GEN APPEARANCE: Normal;  A&OX3  CVS: RRR, S1, S2  LUNGS: CTABL  ABD: Soft; No TTP; + Normoactive BS  EXT: No edema; Pulses 2+ and intact    LABS     Recent Labs   Lab 08/17/22  0324 08/16/22  2049   WBC 6.95 6.12   RBC 3.95 3.99   Hgb 9.8* 10.0*   Hematocrit 32.0* 32.3*   MCV 81.0 81.0   Platelets 237 242       Recent Labs   Lab 08/17/22  0324 08/16/22  2049   Sodium 139 141   Potassium 5.1 5.4*   Chloride 112* 111   CO2 23 23   BUN 23.0* 22.0*   Creatinine 1.3* 1.5*   Glucose 184* 259*   Calcium 8.2* 8.4*   Magnesium  --  1.7       Recent Labs   Lab 08/16/22  2049   ALT 15   AST (SGOT) 14   Bilirubin, Total 0.2   Albumin 3.1*   Alkaline Phosphatase 106       Recent Labs   Lab 08/16/22  2049   Creatine Kinase (CK) 87   hs Troponin-I 3.2             Microbiology Results (last 15 days)       **  No results found for the last 360 hours. **             RADIOLOGY     No results found.  Echo Results       None          No results found for this or any previous visit.    Signed,  Jerelyn Scott, MD  2:58 PM 08/17/2022

## 2022-08-17 NOTE — Progress Notes (Signed)
Performed Evening rounds    Electrodes are secure    Recording is on server    Will continue to monitor

## 2022-08-17 NOTE — Plan of Care (Signed)
Problem: Moderate/High Fall Risk Score >5  Goal: Patient will remain free of falls  Outcome: Progressing  Flowsheets (Taken 08/17/2022 1741)  Moderate Risk (6-13):   MOD-Consider a move closer to [employer]   MOD-Remain with patient during toileting   MOD-Place bedside commode and assistive devices out of sight when not in use   MOD-Re-orient confused patients   MOD-Utilize diversion activities   MOD-Perform dangle, stand, walk (DSW) prior to mobilization   MOD-Request PT/OT consult order for patients with gait/mobility impairment   MOD-Use gait belt when appropriate   MOD-Floor mat at bedside (where available) if appropriate   MOD-Apply bed exit alarm if patient is confused   MOD-Consider activation of bed alarm if appropriate  VH Moderate Risk (6-13):   YELLOW "FALL RISK" ARM BAND   Include family/significant other in multidisciplinary discussion regarding plan of care as appropriate   Remain with patient during toileting     Problem: Neurological Deficit  Goal: Neurological status is stable or improving  Outcome: Progressing  Flowsheets (Taken 08/17/2022 1741)  Neurological status is stable or improving:   Monitor/assess/document neurological assessment (Stroke: every 4 hours)   Monitor/assess NIH Stroke Scale   Observe for seizure activity and initiate seizure precautions if indicated   Perform CAM Assessment

## 2022-08-17 NOTE — Progress Notes (Signed)
LONG TERM MONITORING    As per order, EEG electrodes MRI/CT compatible were applied with paste and stretch tape in accordance to the International 10-20 system and gauze wrap placed for electrode security. The assigned nurse, patient, and family member were informed of the recording protocol and the patient event button.      These EEG electrodes are compatible with MRI and CT.    Patient event button at patient's bedside and not on EEG Cart

## 2022-08-18 ENCOUNTER — Inpatient Hospital Stay: Payer: Medicare Other

## 2022-08-18 ENCOUNTER — Other Ambulatory Visit: Payer: Self-pay

## 2022-08-18 DIAGNOSIS — R569 Unspecified convulsions: Secondary | ICD-10-CM

## 2022-08-18 LAB — ECG 12-LEAD
Atrial Rate: 71 {beats}/min
IHS MUSE NARRATIVE AND IMPRESSION: NORMAL
P Axis: 18 degrees
P-R Interval: 158 ms
Q-T Interval: 426 ms
QRS Duration: 86 ms
QTC Calculation (Bezet): 462 ms
R Axis: -17 degrees
T Axis: 43 degrees
Ventricular Rate: 71 {beats}/min

## 2022-08-18 LAB — CBC
Absolute NRBC: 0 10*3/uL (ref 0.00–0.00)
Hematocrit: 33.5 % — ABNORMAL LOW (ref 34.7–43.7)
Hgb: 10.2 g/dL — ABNORMAL LOW (ref 11.4–14.8)
MCH: 24.6 pg — ABNORMAL LOW (ref 25.1–33.5)
MCHC: 30.4 g/dL — ABNORMAL LOW (ref 31.5–35.8)
MCV: 80.9 fL (ref 78.0–96.0)
MPV: 11.4 fL (ref 8.9–12.5)
Nucleated RBC: 0 /100 WBC (ref 0.0–0.0)
Platelets: 251 10*3/uL (ref 142–346)
RBC: 4.14 10*6/uL (ref 3.90–5.10)
RDW: 14 % (ref 11–15)
WBC: 5.81 10*3/uL (ref 3.10–9.50)

## 2022-08-18 LAB — WHOLE BLOOD GLUCOSE POCT
Whole Blood Glucose POCT: 120 mg/dL — ABNORMAL HIGH (ref 70–100)
Whole Blood Glucose POCT: 201 mg/dL — ABNORMAL HIGH (ref 70–100)
Whole Blood Glucose POCT: 267 mg/dL — ABNORMAL HIGH (ref 70–100)
Whole Blood Glucose POCT: 275 mg/dL — ABNORMAL HIGH (ref 70–100)

## 2022-08-18 LAB — BASIC METABOLIC PANEL
Anion Gap: 6 (ref 5.0–15.0)
BUN: 25 mg/dL — ABNORMAL HIGH (ref 7.0–21.0)
CO2: 22 mEq/L (ref 17–29)
Calcium: 8.3 mg/dL — ABNORMAL LOW (ref 8.5–10.5)
Chloride: 112 mEq/L — ABNORMAL HIGH (ref 99–111)
Creatinine: 1.4 mg/dL — ABNORMAL HIGH (ref 0.4–1.0)
Glucose: 146 mg/dL — ABNORMAL HIGH (ref 70–100)
Potassium: 4.8 mEq/L (ref 3.5–5.3)
Sodium: 140 mEq/L (ref 135–145)
eGFR: 40.7 mL/min/{1.73_m2} — AB (ref >=60)

## 2022-08-18 MED ORDER — LORAZEPAM 0.5 MG PO TABS
0.5000 mg | ORAL_TABLET | Freq: Once | ORAL | Status: DC | PRN
Start: 2022-08-18 — End: 2022-08-18

## 2022-08-18 MED ORDER — INSULIN GLARGINE 100 UNIT/ML SC SOLN
28.0000 [IU] | Freq: Every morning | SUBCUTANEOUS | Status: DC
Start: 2022-08-19 — End: 2022-08-18

## 2022-08-18 NOTE — Progress Notes (Signed)
[facility] Consultation Note                                       Date Time: 08/18/22 5:08 PM  Patient Name: Tracey Sanchez  Requesting Physician: Jerelyn Scott, MD  Date of Admission: 08/16/2022    CC / Reason for Consultation: Seizure     Assessment:   Pt is a 72yo F w/ hx of seizure disorder on Keppra and Xcopri, CKD, HTN, HLD, DM who was transferred from OSH for recurrent seizure like activity. Neuro exam w/ b/l UE 4+/5 and increased tone in b/l LE. 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     Pt has 3 episodes while on EEG. No EEG correlate on push button events. EEG w/ rare generalized spike and wave. 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 Pt endorses recent stress.     Plan:   -D/c cEEG  -Continue Keppra 1.5g bid  -Continue gabapentin 615m bid  -Continue titrting up on Xcopri (currently on 540mdaily)  -Seizure precautions  -F/u w/ Dr. GiGordy Levans an outpatient    Discussed w/ pt. Discussed w/ Dr. NgAlfonse Spruce  Subjective   Pt reports she has had 3 episodes of eyes rolling back and speech arrest while on EEG w/o EEG correlate.     Meds     Home :   Prior to Admission medications    Medication Sig Start Date End Date Taking? Authorizing Provider   amLODIPine (NORVASC) 2.5 MG tablet Take 1 tablet (2.5 mg) by mouth daily   Yes [provider]   atorvastatin (LIPITOR) 40 MG tablet Take 1 tablet (40 mg) by mouth daily   Yes [provider]   carBAMazepine (CARBATROL) 200 MG 12 hr capsule Take 1 capsule (200 mg) by mouth 2 (two) times daily   Yes [provider]   carvedilol (COREG) 12.5 MG tablet Take 1 tablet (12.5 mg) by mouth 2 (two) times daily with meals   Yes [provider]   Cenobamate (Xcopri) 14 x 150 MG & 14 x200 MG Tablet Therapy Pack Take 1 tablet by mouth nightly 08/09/22  Yes GiResa MinerMD   ezetimibe (ZETIA) 10 MG tablet Take 1 tablet (10  mg) by mouth daily   Yes [provider]   furosemide (LASIX) 20 MG tablet Take 1 tablet (20 mg) by mouth daily   Yes [provider]   gabapentin (NEURONTIN) 600 MG tablet Take 1 tablet (600 mg) by mouth 2 (two) times daily   Yes [provider]   Insulin Degludec (TRESIBA SC) Inject 25 Units into the skin daily   Yes [provider]   levETIRAcetam (KEPPRA) 750 MG tablet Take 2 tablets (1,500 mg) by mouth 2 (two) times daily   Yes [provider]   Insulin Zinc Human (NOVOLIN L SC) Inject 15 Unit into the skin PT injects 15 units every morning    [provider]   Insulin Zinc Human (NOVOLIN L SC) Inject 10 Unit into the skin Pt injects 10 units every night    [provider]   sodium bicarbonate 650 MG tablet Take 1 tablet (650 mg) by mouth 4 (four) times daily    [provider]      Inpatient :   Current Facility-Administered Medications  Medication Dose Route Frequency    amLODIPine  2.5 mg Oral Daily    aspirin  81 mg Oral Daily    atorvastatin  40 mg Oral Daily    carvedilol  12.5 mg Oral Q12H SCH    enoxaparin  40 mg Subcutaneous Daily    ezetimibe  10 mg Oral Daily    furosemide  20 mg Oral Daily    gabapentin  600 mg Oral Q12H [place]    [START ON 08/19/2022] insulin glargine  28 Units Subcutaneous QAM    insulin lispro  1-3 Units Subcutaneous QHS    insulin lispro  1-5 Units Subcutaneous TID AC    levETIRAcetam  1,500 mg Oral Q12H SCH    cenobamate  50 mg Oral Daily         Physical Exam:   Temp:  [97.5 F (36.4 C)-98.1 F (36.7 C)] 97.8 F (36.6 C)  Heart Rate:  [64-85] 64  Resp Rate:  [17-18] 18  BP: (109-138)/(68-80) 133/72     Vital Signs:  Reviewed    General: NAD. Cooperative with the exam  Neck: supple  CVS: warm and well perfused  Resp: no respiratory distress  Extremities: no pedal edema, no rashes noted    Mental Status: Awake, alert and oriented x3. Speech is fluent and non dysarthric. Able to follow commands.   Cranial  nerves: PERRL, EOMI, VFF to FC, sensation intact V1-V3, face symmetric, hearing intact to voice, palate rise symmetric, tongue protrudes midline  Motor: No pronator drift. 4/5 b/l UE and B/l LE 5/5. Increased tone b/l LE  Sensory: Light touch intact throughout  Reflexes: 3+ b/l UE  Coordination: FTN and HKS intact  Gait: Deferred     Labs:     Results       Procedure Component Value Units Date/Time    Glucose Whole Blood - POCT [id number]  (Abnormal) Collected: 08/18/22 1532     Updated: 08/18/22 1552     Whole Blood Glucose POCT 275 mg/dL     Glucose Whole Blood - POCT [id number]  (Abnormal) Collected: 08/18/22 1252     Updated: 08/18/22 1255     Whole Blood Glucose POCT 267 mg/dL     Glucose Whole Blood - POCT [id number]  (Abnormal) Collected: 08/18/22 1153     Updated: 08/18/22 1204     Whole Blood Glucose POCT 201 mg/dL     Glucose Whole Blood - POCT [id number]  (Abnormal) Collected: 08/18/22 0717     Updated: 08/18/22 0724     Whole Blood Glucose POCT 120 mg/dL     Basic Metabolic Panel [id number]  (Abnormal) Collected: 08/18/22 0337    Specimen: Blood Updated: 08/18/22 0408     Glucose 146 mg/dL      BUN 25.0 mg/dL      Creatinine 1.4 mg/dL      Calcium 8.3 mg/dL      Sodium 140 mEq/L      Potassium 4.8 mEq/L      Chloride 112 mEq/L      CO2 22 mEq/L      Anion Gap 6.0     eGFR 40.7 mL/min/1.73 m2     CBC without differential [id number]  (Abnormal) Collected: 08/18/22 0337    Specimen: Blood Updated: 08/18/22 0349     WBC 5.81 x10 3/uL      Hgb 10.2 g/dL      Hematocrit 33.5 %      Platelets 251 x10 3/uL      RBC  4.14 x10 6/uL      MCV 80.9 fL      MCH 24.6 pg      MCHC 30.4 g/dL      RDW 14 %      MPV 11.4 fL      Nucleated RBC 0.0 /100 WBC      Absolute NRBC 0.00 x10 3/uL     Glucose Whole Blood - POCT [id number]  (Abnormal) Collected: 08/17/22 2056     Updated: 08/17/22 2104     Whole Blood Glucose POCT 285 mg/dL             Rads:   No results found for this or any previous visit.      Dawson Albers (Kimi)  Helane Rima, MD  [facility]  Epic ChatMertha Baars [facility]  Spectra [phone]

## 2022-08-18 NOTE — Progress Notes (Signed)
[facility]  PROGRESS NOTE      Patient: Tracey Sanchez  Date: 08/18/2022   LOS: 2 Days  Admission Date: 08/16/2022   MRN: [id number]  Attending: Jerelyn Scott, MD  When on service as the attending, please contact me on [place] from 7 AM - 7 PM for non-urgent issues. For urgent matters use XTend page from 7 AM - 7 PM.       ASSESSMENT/PLAN     Tracey Sanchez is a 72 y.o. female with history of seizure disorder, HTN, HLD, type 2 DM on insulin admitted with Seizures    Breakthrough seizure in the setting of   Known seizure disorder  Patient presenting with frequent daily seizures over the past 1 month.  Typical seizure described as eyes rolling back, stuttering speech, during which time patient is aware of seizure activity and is able to tell when an episode is coming on.  Presented to outside hospital for evaluation of seizure and transferred to [facility] for continuous EEG.  - On continuous EEG  - Continue home Keppra, Xcopri, gabapentin  - Neurology consulted, appreciate recommendations  - IV Ativan as needed for prolonged seizure activity    Reported myoclonic jerking  Had MRI of cervical spine, per neurology recommendations.  Degenerative changes noted of intervertebral disc at each level of the cervical spine, with ventral spinal cord impingement at C3-C4, C4-C5, C5-C6, C6-C7.  Multilevel severe neuroforaminal stenosis on the left side.  - Follow-up neurology recommendations, consideration neurosurgery evaluation     Reported EKG changes  EKG here with NSR.  High-sensitivity troponin negative.  No chest pain.  - Continue to monitor     HTN  HLD   - resume amlodipine, furosemide   - resume atorvastatin     DMII  A1c 10.2.  - glargine in place of home tresiba 25 units daily, increase to 28 units due to hyperglycemia   - SSI   - ADA diet     CKD III  Creatinine at baseline, per chart review.  - Continue to monitor BMP     Nutrition: Diabetic/cardiac diet          Patient has BMI=Body mass index is 34.1  kg/m.  Diagnosis: Obesity based on BMI criteria         Recent Labs     08/18/22  0337 08/17/22  0324 08/16/22  2049   Potassium 4.8 5.1 5.4*       Diagnosis: Hyperkalemia          Recent Labs   Lab 08/18/22  0337 08/17/22  0324 08/16/22  2049   Hgb 10.2* 9.8* 10.0*   Hematocrit 33.5* 32.0* 32.3*   MCV 80.9 81.0 81.0   WBC 5.81 6.95 6.12   Platelets 251 237 242           Anemia Diagnosis: Unspecified Anemia (currently unable to determine type)    Code Status: Full Code    Dispo: Anticipate 24 to 48 hours inpatient depending on results of EEG, need for titration of antiepileptic medications    Family Contact: daughter    DVT Prophylaxis:   Current Facility-Administered Medications (Includes Only Anticoagulants, Misc. Hematological)   Medication Dose Route Last Admin    enoxaparin (LOVENOX) syringe 40 mg  40 mg Subcutaneous 40 mg at 08/18/22 0943          CHART  REVIEW & DISCUSSION     The following chart items were reviewed as of 1:35 PM  on 08/18/22:  [x]$  Lab Results [x]$  Imaging Results   [x]$  Problem List  [x]$  Current Orders [x]$  Current Medications  []$  Allergies  []$  Code Status [x]$  Previous Notes   []$  SDoH    The management and plan of care for this patient was discussed with the following specialty consultants:  []$  Cardiology  []$  Gastroenterology                 []$  Infectious Disease  []$  Pulmonology [x]$  Neurology                []$  Nephrology  []$  Neurosurgery []$  Orthopedic Surgery  []$  Heme/Onc  []$  General Surgery []$  Psychiatry                                   []$  Palliative    SUBJECTIVE     Tracey Sanchez denies any new complaints this morning.  She is overall feeling well other than mild nausea this morning.  She declines need for antiemetics.    MEDICATIONS     Current Facility-Administered Medications   Medication Dose Route Frequency    amLODIPine  2.5 mg Oral Daily    aspirin  81 mg Oral Daily    atorvastatin  40 mg Oral Daily    carvedilol  12.5 mg Oral Q12H SCH    enoxaparin  40 mg Subcutaneous Daily     ezetimibe  10 mg Oral Daily    furosemide  20 mg Oral Daily    gabapentin  600 mg Oral Q12H [place]    insulin glargine  25 Units Subcutaneous QAM    insulin lispro  1-3 Units Subcutaneous QHS    insulin lispro  1-5 Units Subcutaneous TID AC    levETIRAcetam  1,500 mg Oral Q12H [place]    cenobamate  50 mg Oral Daily       PHYSICAL EXAM     Vitals:    08/18/22 1155   BP: 127/76   Pulse: 67   Resp: 18   Temp: 97.9 F (36.6 C)   SpO2: 98%     Temperature: Temp  Min: 97.5 F (36.4 C)  Max: 98.8 F (37.1 C)  Pulse: Pulse  Min: 67  Max: 85  Respiratory: Resp  Min: 17  Max: 18  Non-Invasive BP: BP  Min: 109/70  Max: 138/80  Pulse Oximetry SpO2  Min: 98 %  Max: 100 %    Intake and Output Summary (Last 24 hours) at Date Time    Intake/Output Summary (Last 24 hours) at 08/18/2022 1335  Last data filed at 08/18/2022 0943  Gross per 24 hour   Intake 270 ml   Output --   Net 270 ml       GEN APPEARANCE: Normal;  A&OX3  CVS: RRR, S1, S2  LUNGS: CTABL  ABD: Soft; No TTP; + Normoactive BS  EXT: No edema; Pulses 2+ and intact    LABS     Recent Labs   Lab 08/18/22  0337 08/17/22  0324 08/16/22  2049   WBC 5.81 6.95 6.12   RBC 4.14 3.95 3.99   Hgb 10.2* 9.8* 10.0*   Hematocrit 33.5* 32.0* 32.3*   MCV 80.9 81.0 81.0   Platelets 251 237 242         Recent Labs   Lab 08/18/22  0337 08/17/22  0324 08/16/22  2049   Sodium 140 139 141  Potassium 4.8 5.1 5.4*   Chloride 112* 112* 111   CO2 22 23 23   $ BUN 25.0* 23.0* 22.0*   Creatinine 1.4* 1.3* 1.5*   Glucose 146* 184* 259*   Calcium 8.3* 8.2* 8.4*   Magnesium  --   --  1.7         Recent Labs   Lab 08/16/22  2049   ALT 15   AST (SGOT) 14   Bilirubin, Total 0.2   Albumin 3.1*   Alkaline Phosphatase 106         Recent Labs   Lab 08/16/22  2049   Creatine Kinase (CK) 87   hs Troponin-I 3.2               Microbiology Results (last 15 days)       ** No results found for the last 360 hours. **             RADIOLOGY     MRI Cervical Spine WO Contrast    Result Date: 08/18/2022  1. There is degenerative  change of the intervertebral disc at each level of the cervical spine. There is degenerative change of the facet joint bilaterally at each level of the cervical spine. There is overgrowth of the ligamentum flavum bilaterally at each level of the cervical spine. 2. Disc protrusions produce ventral spinal cord impingement at C3-C4, C4-C5, C5-C6, and C6-C7. 3. Disc protrusions and facet arthropathy combine to produce: severe neural foraminal stenosis on the left side at C3-C4, severe spinal neural foraminal stenosis on the left side at C5-C6, and moderate-to-severe neural foraminal stenosis on the left side at C6-C7. There is impingement of the exiting left C4 nerve root, the exiting left C6 nerve root, and the exiting left C7 nerve root. 4. The cervical spondylosis is mildly progressive since September 14, 2018. Melina Copa, MD 08/18/2022 11:28 AM   Echo Results       None          No results found for this or any previous visit.    Signed,  Jerelyn Scott, MD  1:35 PM 08/18/2022

## 2022-08-18 NOTE — Progress Notes (Signed)
The following test cEEG has been discontinued as per order. The EEG electrodes were removed with Mavidon and skin break down was not present. If present the following areas were affected No visible skin break-down and the assigned nurse was informed. The report will follow.

## 2022-08-18 NOTE — Plan of Care (Signed)
Problem: Moderate/High Fall Risk Score >5  Goal: Patient will remain free of falls  08/18/2022 1702 by Nelani Schmelzle, Malva Cogan, RN  Outcome: Progressing  Flowsheets (Taken 08/18/2022 1702)  Moderate Risk (6-13):   MOD-Consider a move closer to [place]   MOD-Remain with patient during toileting   MOD-Floor mat at bedside (where available) if appropriate   MOD-Apply bed exit alarm if patient is confused   MOD-Consider activation of bed alarm if appropriate   MOD-Place bedside commode and assistive devices out of sight when not in use   MOD-Re-orient confused patients   MOD-Utilize diversion activities   MOD-Perform dangle, stand, walk (DSW) prior to mobilization   MOD-Request PT/OT consult order for patients with gait/mobility impairment   MOD-Use gait belt when appropriate   MOD-include family in multidisciplinary POC discussions   MOD- Consider video monitoring  High (Greater than 13):   HIGH-Pharmacy to initiate evaluation and intervention per protocol   HIGH-Initiate use of floor mats as appropriate   HIGH-Consider use of low bed  VH Moderate Risk (6-13):   ALL REQUIRED LOW INTERVENTIONS   INITIATE YELLOW "FALL RISK" SIGNAGE   YELLOW NON-SKID SLIPPERS   YELLOW "FALL RISK" ARM BAND   USE OF BED EXIT ALARM IF PATIENT IS CONFUSED OR IMPULSIVE. PLACE RESET BED ALARM SIGN ABOVE BED   PLACE FALL RISK LEVEL ON WHITE BOARD FOR COMMUNICATION PURPOSES IN PATIENT'S ROOM   Include family/significant other in multidisciplinary discussion regarding plan of care as appropriate   Remain with patient during toileting   Request PT/OT therapy consult order from Physician for patients with gait/mobility impairment   Use assistive devices   Use chair-pad alarm device   Use of floor mat   Use of Roll Guard   Use of "STOP ask for help" sign   Patient refuses to have falls interventions  VH High Risk (Greater than 13):   A safety companion may be used when deemed appropriate by the Primary RN and Clinical Administrator   Keep door  open for better visibility   Include family/significant other in multidisciplinary discussion regarding plan of care as appropriate   PATIENT IS TO BE SUPERVISED FOR ALL TOILETING ACTIVITIES   A CHAIR PAD ALARM WILL BE USED WHEN PATIENT IS UP SITTING IN A CHAIR  08/18/2022 1701 by Vickii Penna, RN  Outcome: Progressing  Flowsheets (Taken 08/18/2022 1701)  Moderate Risk (6-13):   MOD-Apply bed exit alarm if patient is confused   MOD-Floor mat at bedside (where available) if appropriate   MOD-Consider activation of bed alarm if appropriate   MOD-Consider a move closer to [place]   MOD-Remain with patient during toileting   MOD-Place bedside commode and assistive devices out of sight when not in use   MOD-Re-orient confused patients   MOD-Utilize diversion activities   MOD-Perform dangle, stand, walk (DSW) prior to mobilization   MOD-Request PT/OT consult order for patients with gait/mobility impairment   MOD-Use gait belt when appropriate   MOD-include family in multidisciplinary POC discussions   MOD- Consider video monitoring     Problem: Neurological Deficit  Goal: Neurological status is stable or improving  Outcome: Progressing  Flowsheets (Taken 08/18/2022 1702)  Neurological status is stable or improving:   Monitor/assess/document neurological assessment (Stroke: every 4 hours)   Monitor/assess NIH Stroke Scale   Observe for seizure activity and initiate seizure precautions if indicated   Perform CAM Assessment   Re-assess NIH Stroke Scale for any change in status

## 2022-08-18 NOTE — Discharge Summary (Signed)
[facility]      Patient: Tracey Sanchez  Admission Date: 08/16/2022   DOB: 03-22-1951  Discharge Date: 08/18/2022    MRN: [id number]  Discharge Attending:Toni Demo Chari Manning, MD     Referring Physician: Zoe Lan, MD  PCP: Zoe Lan, MD       DISCHARGE SUMMARY     Discharge Information   Admission Diagnosis:   Seizures    Discharge Diagnosis:   Active Hospital Problems    Diagnosis    Seizures    OSA on CPAP    Stage 3a chronic kidney disease    Anemia    Gastroesophageal reflux disease    Chronic diastolic congestive heart failure    Hyperlipidemia        Admission Condition: Seizure  Discharge Condition: Stable  Consultants: Neurology  Functional Status: Good  Discharged to: [facility] Course   Presentation History   Tracey Sanchez is a 72 y.o. female with a PMHx of CKD 3, seizure disorder, HTN, DMII, HLD, on Keppra (long standing)+Xcopri (started 1 week ago), reports compliance, presented to [facility] [place] [facility] due to recurrent seizures for past several days described as hand twitching/eyes rolling back.  She reportedly had an episode that was witnessed in sending ER there and was given ativan. Was also reported to have EKG ST depression, Lead II and V6, TWI I, II, III, aVF (findings appearred new since last EKG 6/23), but patient denied any recent chest pain. Trops were 33 --> 30. Neurology at that facility discussed case with neuro here, Dr. Helane Rima, who agreed to consult and continuous EEG was recommended. Patient was thus transferred here to [facility] and admitted to hospitalist service. Patient seen on arrival to the nursing unit. Her daughter is at bedside. Patient reports "little" seizures that occur multiple times per day. She says her eyes roll back and she has trouble speaking. She is fully conscious when these occur and they only last for a few seconds.     See HPI for details.    Hospital Course (2 Days)   Breakthrough seizure in the setting of    Known seizure disorder  Patient presenting with frequent daily seizures over the past 1 month.  Typical seizure described as eyes rolling back, stuttering speech, during which time patient is aware of seizure activity and is able to tell when an episode is coming on.  Presented to outside hospital for evaluation of seizure and transferred to [facility] for continuous EEG. She was started on continuous EEG without seizure activity. Neurology consulted during admission.   - Continue home Keppra, Xcopri, gabapentin  - Follow-up with epileptologist as scheduled      Cervical disc disease   Had MRI of cervical spine, per neurology recommendations.  Degenerative changes noted of intervertebral disc at each level of the cervical spine, with ventral spinal cord impingement at C3-C4, C4-C5, C5-C6, C6-C7.  Multilevel severe neuroforaminal stenosis on the left side.     Reported EKG changes  EKG here with NSR.  High-sensitivity troponin negative.  No chest pain.  - Continue to monitor     HTN  HLD   - resume amlodipine, furosemide   - resume atorvastatin     DMII  A1c 10.2.  - Resume home Tresiba, Novolin N on discharge    - SSI   - ADA diet     CKD III  Creatinine at baseline, per chart review.  Procedures/Imaging:   Upon my review:   MRI Cervical Spine WO Contrast    Result Date: 08/18/2022  1. There is degenerative change of the intervertebral disc at each level of the cervical spine. There is degenerative change of the facet joint bilaterally at each level of the cervical spine. There is overgrowth of the ligamentum flavum bilaterally at each level of the cervical spine. 2. Disc protrusions produce ventral spinal cord impingement at C3-C4, C4-C5, C5-C6, and C6-C7. 3. Disc protrusions and facet arthropathy combine to produce: severe neural foraminal stenosis on the left side at C3-C4, severe spinal neural foraminal stenosis on the left side at C5-C6, and moderate-to-severe neural foraminal stenosis on the left side at C6-C7.  There is impingement of the exiting left C4 nerve root, the exiting left C6 nerve root, and the exiting left C7 nerve root. 4. The cervical spondylosis is mildly progressive since September 14, 2018. Melina Copa, MD 08/18/2022 11:28 AM            Progress Note/Physical Exam at Discharge     Subjective:Patient is stable for discharge.     Vitals:    08/18/22 0943 08/18/22 0944 08/18/22 1155 08/18/22 1533   BP: 138/72 138/80 127/76 133/72   Pulse:  72 67 64   Resp:   18 18   Temp:   97.9 F (36.6 C) 97.8 F (36.6 C)   TempSrc:   Oral Oral   SpO2:   98% 97%   Weight:       Height:         General: NAD, AAOx3  Cardiovascular: RRR  Lungs: CTABL  Abdomen: Soft, +BS, NT/ND  Extremities: No edema       Diagnostics     Labs/Studies Pending at Discharge: No    Last Labs   Recent Labs   Lab 08/18/22  0337 08/17/22  0324 08/16/22  2049   WBC 5.81 6.95 6.12   RBC 4.14 3.95 3.99   Hgb 10.2* 9.8* 10.0*   Hematocrit 33.5* 32.0* 32.3*   MCV 80.9 81.0 81.0   Platelets 251 237 242       Recent Labs   Lab 08/18/22  0337 08/17/22  0324 08/16/22  2049   Sodium 140 139 141   Potassium 4.8 5.1 5.4*   Chloride 112* 112* 111   CO2 22 23 23   $ BUN 25.0* 23.0* 22.0*   Creatinine 1.4* 1.3* 1.5*   Glucose 146* 184* 259*   Calcium 8.3* 8.2* 8.4*   Magnesium  --   --  1.7       Microbiology Results (last 15 days)       ** No results found for the last 360 hours. **             Patient Instructions   Discharge Diet: cardiac/diabetic  Discharge Activity: as tolerated    Follow Up Appointment:   Follow-up Information       Al-Khateeb, Quintin Alto, MD. Schedule an appointment as soon as possible for a visit in 1 week(s).    Specialty: Internal Medicine  Why: For hospital follow-up, As needed  Contact information:  [place] [address]  [phone]               Resa Miner, MD Follow up.    Specialties: Clinical Neurophysiology, Neurology, Epilepsy  Why: as scheduled  Contact information:  [address]  [place]  [place] [id number]  [phone]  Discharge Medications:     Medication List        START taking these medications      aspirin 81 MG chewable tablet  Start taking on: August 19, 2022            CHANGE how you take these medications      Xcopri 14 x 150 MG & 14 x200 MG Tbpk  Generic drug: Cenobamate  Take 1 tablet by mouth nightly  What changed: Another medication with the same name was removed. Continue taking this medication, and follow the directions you see here.            CONTINUE taking these medications      amLODIPine 2.5 MG tablet  Commonly known as: NORVASC     atorvastatin 80 MG tablet  Commonly known as: LIPITOR     carvedilol 12.5 MG tablet  Commonly known as: COREG     ezetimibe 10 MG tablet  Commonly known as: ZETIA     gabapentin 300 MG capsule  Commonly known as: NEURONTIN     levETIRAcetam 750 MG tablet  Commonly known as: KEPPRA     NOVOLIN L SC     sodium bicarbonate 650 MG tablet     TRESIBA SC            STOP taking these medications      carBAMazepine 200 MG 12 hr capsule  Commonly known as: CARBATROL            ASK your doctor about these medications      furosemide 20 MG tablet  Commonly known as: LASIX               Time spent examining patient, discussing with patient/family regarding hospital course, chart review, reconciling medications and discharge planning: 35 minutes.    Signed,  Jerelyn Scott, MD    5:14 PM 08/18/2022

## 2022-08-18 NOTE — Discharge Instr - AVS First Page (Addendum)
[facility] DISCHARGE INSTRUCTIONS     Date of Admission: 08/16/2022    Date of Discharge: 08/18/2022    Discharge Physician: Jerelyn Scott, MD    Dear Tracey Sanchez,     Thank you for choosing [facility] for your emergency care needs. We strive to provide EXCELLENT care to you and your family.     In an effort to explain clearly why you were here in the hospital, I've written a very brief summary. I hope that you find it useful. Other details including formal diagnosis, medication changes, follow up appointment recommendations, and access to MyChart for formal medical records can be found in this packet.       You were admitted for Seizures for which you were started on continuous EEG.  Your EEG did not show any seizure activity.  You can be discharged home on your current medications, Keppra, Xcopri, gabapentin.  Please follow-up with your seizure doctor as scheduled.    Make sure to follow up with our [facility] or Deana Leonie Green, MD your primary care doctor for follow-up. I cannot stress the importance of follow up enough.    Make sure to bring the following to your doctors appointments:  Medications in their original bottles  Glucometer/blood sugar log (if diabetic)   Weight log (if you have heart failure)    If you are unable to obtain an appointment, unable to obtain newly prescribed medications, or are unclear about any of your discharge instructions please contact me at [phone] (M-F, 8am-3pm) or weekends and after hours via the hospital operator [phone]) [phone], the hospital case manager, or your primary care physician.    Finally, as your discharging physician, you may be receiving a survey which is regarding my care. I would greatly value and appreciate your feedback as I strive for excellence.     Respectfully yours,    Jerelyn Scott, MD    Consultants: Neurology    Pending Studies: None    Further Instructions:     If you need any further refills,  please discuss with your primary care doctor or specialist.     Discharge Diet: Cardiac/diabetic diet    Surgery/Procedure: None

## 2022-08-18 NOTE — Nursing Progress Note (Signed)
Patient discharged to home via wheelchair accompanied by hospital transport team with all the belongings pt came with. Discharge instructions and follow up plan explained to pt and he/she demonstrated understanding, no further questions at this time. IV line removed, dressing applied.Patient's daughter picking up pt at the patient entrance    Latest VS stable.

## 2022-08-18 NOTE — Progress Notes (Deleted)
LONG TERM MONITORING    As per order, EEG electrodes CT Compatible were applied with paste and stretch tape in accordance to the International 10-20 system and gauze wrap placed for electrode security. The assigned nurse were informed of the recording protocol and the patient event button.        These EEG electrodes are compatible with CT only, not MRI.

## 2022-08-18 NOTE — Progress Notes (Signed)
Performed Morning rounds    Electrodes are secure    Recording is on server    Will continue to monitor

## 2022-08-18 NOTE — UM Notes (Signed)
Tracey Sanchez  Female, 72 y.o., 01/04/1951  MRN: [id number]      Auth #: [id number]        08/16/22 2014  ADMIT TO INPATIENT  Once        Diagnosis: Seizures  Level of Care: Intermediate Care  Patient Class: Inpatient   References:    [facility] Bed Placement Criteria    [facility] Bed Placement Criteria    [facility] Bed Placement Criteria    [facility] Bed Placement Criteria    [facility] Bed Placement Criteria    New Service Definitions Feb 2023   Question Answer Comment   Admitting Physician Lonn [place]    Service: Medicine    Estimated Length of Stay > or = to 2 midnights    Tentative Discharge Plan? Home or Self Care    Does patient need telemetry? Yes    Is patient 18 yrs or greater? Yes    Telemetry type (separate Telemetry order is also required): Adult telemetry            RETRO REVIEW    Discharge Date2/18/2024 6:49 PM       72 y.o. female with a PMHx of CKD 3, seizure disorder, HTN, DMII, HLD, on Keppra (long standing)+Xcopri (started 1 week ago), reports compliance, presented to [facility] due to recurrent seizures for past several days described as hand twitching/eyes rolling back.  She reportedly had an episode that was witnessed in sending ER there and was given ativan. Was also reported to have EKG ST depression, Lead II and V6, TWI I, II, III, aVF (findings appearred new since last EKG 6/23), but patient denied any recent chest pain. Trops were 33 --> 30. Neurology at that facility discussed case with neuro here, Dr. Helane Rima, who agreed to consult and continuous EEG was recommended. Patient was thus transferred here to [facility] and admitted to hospitalist service. Patient seen on arrival to the nursing unit. Her daughter is at bedside. Patient reports "little" seizures that occur multiple times per day. She says her eyes roll back and she has trouble speaking. She is fully conscious when these occur and they only last for a few seconds.      Past Medical History:   Diagnosis Date    Chronic kidney  disease     Convulsions     Diabetes mellitus     Hypertension     Lung disease     Sleep apnea            97.7 F (36.5 C) Oral 75 100 % -- 17 152/78       H&P:    ASSESSMENT & PLAN      Tracey Sanchez is a 72 y.o. female admitted with Seizures.     Patient Active Hospital Problem List:     Seizure like activity  Chronic seizure disorder   - nursing notes, vitals reviewed   - records in care everywhere   - resume carbamasepine, Xcopri, Keppra, gabapentin per home dose   - continuous EEG ordered   - consult placed for neuro, Dr. Helane Rima - PLEASE CALL IN A.M.   - seizure precautions   - IV ativan PRN for seizure activity   - stat lactic acid, CK, TSH, CMP, CBC ordered by me     EKG changes (OLF)   - no chest pain   - monitor on tele   - EKG ordered by me   - trops reported as 33 >  30   - consider cardiology consult in a.m.     HTN  HLD   - resume amlodipine, furosemide   - resume atorvastatin   - monitor     DMII   - glargine in place of home tresiba 25 units daily   - SSI   - ADA diet   - check A1C     CKD III   - stat labs ordered   - monitor I&O   - avoid nephrotoxins     Patient has BMI=Body mass index is 33.59 kg/m.  Diagnosis: Obesity based on BMI criteria       Nutrition: carb consistent, heart healthy diet         IM[facility] Neurology Consultation Note                                       Date Time: 08/17/22 2:42 PM    CC / Reason for Consultation: Seizure      Assessment:   Pt is a 72yo F w/ hx of seizure disorder on Keppra and Xcopri, CKD, HTN, HLD, DM who was transferred from OSH for recurrent seizure like activity. Neuro exam w/ b/l UE 4/5 and increased tone in b/l LE. Will start cEEG to further assess episodes of speech arrest and eyes rolling back. However, exam also suggestive of myelopathy so would recommend ruling out C spine stenosis.      Plan:   -cEEG  -MRI C spine w/o contrast  -Continue Keppra 1.5g bid  -Continue gabapentin 692m bid  -Continue Xcopri (currently on 553mdaily)  -Seizure  precautions    Inpatient :          Current Facility-Administered Medications   Medication Dose Route Frequency    amLODIPine  2.5 mg Oral Daily    aspirin  81 mg Oral Daily    atorvastatin  40 mg Oral Daily    carvedilol  12.5 mg Oral Q12H [place]  ezetimibe  10 mg Oral Daily    furosemide  20 mg Oral Daily    gabapentin  600 mg Oral Q12H SCH    heparin (porcine)  5,000 Units Subcutaneous Q8H [place]  insulin glargine  25 Units Subcutaneous QAM    insulin lispro  1-3 Units Subcutaneous QHS    insulin lispro  1-5 Units Subcutaneous TID AC    levETIRAcetam  1,500 mg Oral Q12H [place]  cenobamate  25 mg Oral Daily    [START ON 08/18/2022] cenobamate  50 mg Oral Daily     Glucose Whole Blood - POCT [[id number](Abnormal) Collected: 08/17/22 1155        Updated: 08/17/22 1212       Whole Blood Glucose POCT 260 mg/dL       Glucose Whole Blood - POCT [[id number](Abnormal) Collected: 08/17/22 0800       Updated: 08/17/22 0803       Whole Blood Glucose POCT 151 mg/dL       Basic Metabolic Panel [[id number](Abnormal) Collected: 08/17/22 0324     Specimen: Blood Updated: 08/17/22 0359       Glucose 184 mg/dL         BUN 23.0 mg/dL         Creatinine 1.3 mg/dL         Calcium 8.2 mg/dL  Sodium 139 mEq/L         Potassium 5.1 mEq/L         Chloride 112 mEq/L         CO2 23 mEq/L         Anion Gap 4.0     CBC and differential [id number]  (Abnormal) Collected: 08/17/22 0324      Specimen: Blood Updated: 08/17/22 0342       WBC 6.95 x10 3/uL         Hgb 9.8 g/dL         Hematocrit 32.0 %         MCH 24.8 pg         MCHC 30.6 g/dL       Comprehensive metabolic panel [id number]  (Abnormal) Collected: 08/16/22 2049      Specimen: Blood Updated: 08/16/22 2136       Glucose 259 mg/dL         BUN 22.0 mg/dL         Creatinine 1.5 mg/dL         Sodium 141 mEq/L         Potassium 5.4 mEq/L         Chloride 111 mEq/L         CO2 23 mEq/L         Calcium 8.4 mg/dL         Protein, Total 6.4 g/dL         Albumin 3.1 g/dL         eGFR  37.4 mL/min/1.73 m      Urinalysis Reflex to Microscopic Exam [id number]  (Abnormal) Collected: 08/16/22 2054      Specimen: Urine Updated: 08/16/22 2116       Urine Type Clean Catch       Protein, UR 70= 1+     CBC and differential [id number]  (Abnormal) Collected: 08/16/22 2049      Specimen: Blood Updated: 08/16/22 2104       WBC 6.12 x10 3/uL         Hgb 10.0 g/dL         Hematocrit 32.3 %         MCHC 31.0 g/dL        NEURODIAGNOSTIC LABORATORY  LONG TERM ELECTROENCEPHALOGRAM MONITORING WITH VIDEO        CVEEG   Date/Time:   0600 - 1640 08/18/2022         IMPRESSION:  Normal awake and asleep background.  Rare generalized spike and wake interictal discharges.  No seizures.     This EEG is unchanged from prior day's record.   INDICATION:   A long-term video EEG monitoring was recommended in this 72 y.o. year-old patient  with h/o epilepsy with recurrent seizures to evaluate any underlying non-convulsive status or seizures.      CNS MEDS:  Gabapentin 66m q12h  LEV 15055mq12h  Xcopri 5064mD   Per neurology consult EEG report on 08/17/2022:mild encephalopathy   EEG: mild encephalopathy,  No seizures.      Treatment:   Lorazepam (ATIVAN) IV  Naloxone (NARCAN) injection 0.2 mg IV PRN    2/17 NEURO PROG NOTE  Preliminary EEG Report     mild encephalopathy  No seizures or epileptiform discharges.     Full note to follow     AsmFeliberto GottronD    08/17/22 11:56:47 98.1 F (36.7 C) -- 74 17 112/61 97 %  levETIRAcetam (KEPPRA) tablet 1,500 mg  Dose: 1,500 mg  Freq: Every 12 hours scheduled Route: PO       Mata Rowen [place] RN, BSN, ACM-RN, CCM  Utilization Review RN Case Manager II  [facility]  Utilization Review Department  [address]  [place], [place] [address]  T [phone] (Confidential voice mail) C [phone] (Confidential voice mail)   Jerilynn Mages [phone] F [phone]  [email or web address]

## 2022-08-18 NOTE — Progress Notes (Signed)
Checked on patient to find that she somehow removed her guaze wrap on her head for her cEEG.     Reattached O1, F8, Pz, T4, and rewrapped her head with gauze.    Will f/u with her before leaving

## 2022-08-18 NOTE — Plan of Care (Signed)
NURSING SHIFT NOTE     Patient: Tracey Sanchez  Day: 2      SHIFT EVENTS     Shift Narrative/Significant Events (PRN med administration, fall, RRT, etc.):     EEG in progress, patient has been stable through the night.    Safety and fall precautions remain in place. Purposeful rounding completed.          ASSESSMENT     Changes in assessment from patient's baseline this shift:    Neuro: No  CV: No  Pulm: No  Peripheral Vascular: No  HEENT: No  GI: No  BM during shift: No, Last BM:    GU: No   Integ: No  MS: No    Pain: None  Pain Interventions:   Medications Utilized:     Mobility: PMP Activity: Step 6 - Walks in Room of Distance Walked (ft) (Step 6,7):  (12 feet)           Lines     Patient Lines/Drains/Airways Status       Active Lines, Drains and Airways       Name Placement date Placement time Site Days    Peripheral IV 08/16/22 Anterior;Proximal;Right Forearm 08/16/22  1900  Forearm  1                         VITAL SIGNS     Vitals:    08/18/22 0719   BP: 138/80   Pulse: 72   Resp: 18   Temp: 97.7 F (36.5 C)   SpO2: 99%       Temp  Min: 97.5 F (36.4 C)  Max: 98.8 F (37.1 C)  Pulse  Min: 72  Max: 85  Resp  Min: 16  Max: 18  BP  Min: 109/70  Max: 153/81  SpO2  Min: 97 %  Max: 100 %      Intake/Output Summary (Last 24 hours) at 08/18/2022 0831  Last data filed at 08/17/2022 1000  Gross per 24 hour   Intake 300 ml   Output --   Net 300 ml              OXYGEN WEANING       CARE PLAN        Problem: Moderate/High Fall Risk Score >5  Goal: Patient will remain free of falls  Outcome: Progressing  Flowsheets (Taken 08/17/2022 1741 by Vickii Penna, RN)  Moderate Risk (6-13):   MOD-Consider a move closer to [place]   MOD-Remain with patient during toileting   MOD-Place bedside commode and assistive devices out of sight when not in use   MOD-Re-orient confused patients   MOD-Utilize diversion activities   MOD-Perform dangle, stand, walk (DSW) prior to mobilization   MOD-Request PT/OT consult order for  patients with gait/mobility impairment   MOD-Use gait belt when appropriate   MOD-Floor mat at bedside (where available) if appropriate   MOD-Apply bed exit alarm if patient is confused   MOD-Consider activation of bed alarm if appropriate     Problem: Neurological Deficit  Goal: Neurological status is stable or improving  Outcome: Progressing  Flowsheets (Taken 08/17/2022 1741 by Vickii Penna, RN)  Neurological status is stable or improving:   Monitor/assess/document neurological assessment (Stroke: every 4 hours)   Monitor/assess NIH Stroke Scale   Observe for seizure activity and initiate seizure precautions if indicated   Perform CAM Assessment     Problem: Compromised Activity/Mobility  Goal: Activity/Mobility Interventions  Outcome: Progressing

## 2022-08-18 NOTE — Procedures (Signed)
NEURODIAGNOSTIC LABORATORY  LONG TERM ELECTROENCEPHALOGRAM MONITORING WITH VIDEO       CVEEG   Date/Time:   0600 - 1640 08/18/2022  Patient Name: Tracey Sanchez, Tracey Sanchez  DOB: 10-21-1950  [id number]  MRN: [id number]      IMPRESSION:  Normal awake and asleep background.  Rare generalized spike and wake interictal discharges.  No seizures.    This EEG is unchanged from prior day's record.      "The following report will be updated at intervals. If there are any 'spells', please push the event button.For stat results:  the tech should call the reading neurologist on cell ".      INDICATION:   A long-term video EEG monitoring was recommended in this 72 y.o. year-old patient  with h/o epilepsy with recurrent seizures to evaluate any underlying non-convulsive status or seizures.     CNS MEDS:  Gabapentin 659m q12h  LEV 15019mq12h  Xcopri 5024mD    DESCRIPTION:  The posterior dominant rhythm is 9Hz$ . The background is continuous, reactive, symmetric and consists of 8-10 Hz alpha activity which attenuates with eye opening.    Rare generalized high voltage 2-3 Hz spike and wave discharges likely represent interictal discharges.     No seizures .    Focal abnormality:no  State changes:sleep II  Push button events: No  Breech: no  Artifact:emg  EKG: regular      Recording Technique / Montage :    This electroencephalogram was recorded using both referential and differential montages. Using a digital machine the 18 channel International 10-20 System of electrode placement was used    Digital Analysis Methodology: (Persyst software 14 )  is available for the study: No  ?     EEG snapshot            Interictal discharge            AsmFeliberto GottronD    Board Certified,Neurology  Board Certified, Neurophysiology-Critical Care EEG      This note was generated by the Epic EMR system/ Dragon speech recognition and may contain inherent errors or omissions not intended by the user. Grammatical errors, random word insertions, deletions, pronoun  errors and incomplete sentences are occasional consequences of this technology due to software limitations. Not all errors are caught or corrected. If there are questions or concerns about the content of this note or information contained within the body of this dictation they should be addressed directly with the author for clarification

## 2022-08-18 NOTE — Provider Clarification Note (Signed)
Patient Name: Tracey Sanchez, Tracey Sanchez  Account #: [id number]   MR #: [id number]  Discharge Date:      Dr.Johnnell Liou, Ian Bushman,     Encephalopathy is documented at neurology consult EEG report on 08/17/2022. Additional clarification regarding the type of encephalopathy is requested.    History/Risk Factors:  PMHx of CKD 3, seizure disorder, HTN, DMII, HLD, on Keppra (long standing)+Xcopri (started 1 week ago),     Clinical Indicators:   Per neurology consult EEG report on 08/17/2022:mild encephalopathy   EEG: mild encephalopathy,  No seizures.     Treatment:   Lorazepam (ATIVAN) IV  Naloxone (NARCAN) injection 0.2 mg IV PRN      Question: Please clarify the type of encephalopathy, if known:  Metabolic Encephalopathy  Toxic Encephalopathy  Anoxic Encephalopathy   Traumatic Encephalopathy  Post Ictal from seizure  Other condition (please specify)     Thank you,  Earle Gell, MD CCDS CDS2@703$ [id number]  Date:  08/18/2022    PROVIDER RESPONSE (Choose from list above or add free text): Not encephalopathic clinically

## 2022-08-19 ENCOUNTER — Emergency Department
Admission: EM | Admit: 2022-08-19 | Discharge: 2022-08-20 | Disposition: A | Discharge status: Home / Self Care | Payer: Medicare Other | Attending: Emergency Medicine | Admitting: Emergency Medicine

## 2022-08-19 DIAGNOSIS — G40909 Epilepsy, unspecified, not intractable, without status epilepticus: Secondary | ICD-10-CM | POA: Insufficient documentation

## 2022-08-19 DIAGNOSIS — R079 Chest pain, unspecified: Secondary | ICD-10-CM

## 2022-08-19 DIAGNOSIS — I129 Hypertensive chronic kidney disease with stage 1 through stage 4 chronic kidney disease, or unspecified chronic kidney disease: Secondary | ICD-10-CM | POA: Insufficient documentation

## 2022-08-19 DIAGNOSIS — Z8669 Personal history of other diseases of the nervous system and sense organs: Secondary | ICD-10-CM

## 2022-08-19 DIAGNOSIS — E1165 Type 2 diabetes mellitus with hyperglycemia: Secondary | ICD-10-CM

## 2022-08-19 DIAGNOSIS — R569 Unspecified convulsions: Secondary | ICD-10-CM

## 2022-08-19 DIAGNOSIS — I10 Essential (primary) hypertension: Secondary | ICD-10-CM

## 2022-08-19 DIAGNOSIS — R030 Elevated blood-pressure reading, without diagnosis of hypertension: Secondary | ICD-10-CM | POA: Insufficient documentation

## 2022-08-19 DIAGNOSIS — N189 Chronic kidney disease, unspecified: Secondary | ICD-10-CM | POA: Insufficient documentation

## 2022-08-19 LAB — COMPREHENSIVE METABOLIC PANEL
ALT: 18 U/L (ref 0–55)
AST (SGOT): 17 U/L (ref 5–41)
Albumin/Globulin Ratio: 0.9 (ref 0.9–2.2)
Albumin: 3.4 g/dL — ABNORMAL LOW (ref 3.5–5.0)
Alkaline Phosphatase: 139 U/L — ABNORMAL HIGH (ref 37–117)
Anion Gap: 8 (ref 5.0–15.0)
BUN: 27 mg/dL — ABNORMAL HIGH (ref 7.0–21.0)
Bilirubin, Total: 0.2 mg/dL (ref 0.2–1.2)
CO2: 22 mEq/L (ref 17–29)
Calcium: 8.3 mg/dL — ABNORMAL LOW (ref 8.5–10.5)
Chloride: 107 mEq/L (ref 99–111)
Creatinine: 1.5 mg/dL — ABNORMAL HIGH (ref 0.4–1.0)
Globulin: 3.8 g/dL — ABNORMAL HIGH (ref 2.0–3.6)
Glucose: 257 mg/dL — ABNORMAL HIGH (ref 70–100)
Potassium: 5 mEq/L (ref 3.5–5.3)
Protein, Total: 7.2 g/dL (ref 6.0–8.3)
Sodium: 137 mEq/L (ref 135–145)
eGFR: 37.4 mL/min/{1.73_m2} — AB (ref >=60)

## 2022-08-19 LAB — URINALYSIS WITH REFLEX TO MICROSCOPIC EXAM - REFLEX TO CULTURE
Bilirubin, UA: NEGATIVE
Blood, UA: NEGATIVE
Ketones UA: NEGATIVE
Leukocyte Esterase, UA: NEGATIVE
Nitrite, UA: NEGATIVE
Specific Gravity UA: 1.007 (ref 1.001–1.035)
Urine pH: 6.5 (ref 5.0–8.0)
Urobilinogen, UA: NORMAL mg/dL (ref 0.2–2.0)

## 2022-08-19 LAB — CBC AND DIFFERENTIAL
Absolute NRBC: 0 10*3/uL (ref 0.00–0.00)
Basophils Absolute Automated: 0.03 10*3/uL (ref 0.00–0.08)
Basophils Automated: 0.4 %
Eosinophils Absolute Automated: 0.11 10*3/uL (ref 0.00–0.44)
Eosinophils Automated: 1.6 %
Hematocrit: 33.1 % — ABNORMAL LOW (ref 34.7–43.7)
Hgb: 9.9 g/dL — ABNORMAL LOW (ref 11.4–14.8)
Immature Granulocytes Absolute: 0.01 10*3/uL (ref 0.00–0.07)
Immature Granulocytes: 0.1 %
Instrument Absolute Neutrophil Count: 2.85 10*3/uL (ref 1.10–6.33)
Lymphocytes Absolute Automated: 3.39 10*3/uL — ABNORMAL HIGH (ref 0.42–3.22)
Lymphocytes Automated: 48.8 %
MCH: 24.6 pg — ABNORMAL LOW (ref 25.1–33.5)
MCHC: 29.9 g/dL — ABNORMAL LOW (ref 31.5–35.8)
MCV: 82.1 fL (ref 78.0–96.0)
MPV: 10.8 fL (ref 8.9–12.5)
Monocytes Absolute Automated: 0.55 10*3/uL (ref 0.21–0.85)
Monocytes: 7.9 %
Neutrophils Absolute: 2.85 10*3/uL (ref 1.10–6.33)
Neutrophils: 41.2 %
Nucleated RBC: 0 /100 WBC (ref 0.0–0.0)
Platelets: 225 10*3/uL (ref 142–346)
RBC: 4.03 10*6/uL (ref 3.90–5.10)
RDW: 14 % (ref 11–15)
WBC: 6.94 10*3/uL (ref 3.10–9.50)

## 2022-08-19 LAB — WHOLE BLOOD GLUCOSE POCT
Whole Blood Glucose POCT: 226 mg/dL — ABNORMAL HIGH (ref 70–100)
Whole Blood Glucose POCT: 244 mg/dL — ABNORMAL HIGH (ref 70–100)

## 2022-08-19 MED ORDER — CARVEDILOL 3.125 MG PO TABS
12.5000 mg | ORAL_TABLET | Freq: Once | ORAL | Status: AC
Start: 2022-08-19 — End: 2022-08-19
  Administered 2022-08-19: 12.5 mg via ORAL
  Filled 2022-08-19: qty 4

## 2022-08-19 NOTE — ED Triage Notes (Signed)
Tracey Sanchez is a 72 y.o. female  With chief complaint of seizure and hyperglycemia. Pt has hx of seizure, was seen in ED last week and discharged for increased seizure activity. Pt also complains of hyperglycemia, last check was 471.

## 2022-08-19 NOTE — ED Triage Notes (Signed)
[facility] EMERGENCY DEPARTMENT  Provider in Triage Note        Patient Name: Tracey Sanchez    Chief Complaint:   Chief Complaint   Patient presents with    Seizures       HPI: Tracey Sanchez is a 72 y.o. female, who has had a rapid medical screening evaluation initiated by myself. Hx Keppra and Xcopri (reports compliance), CKD, HTN, HLD, DM  presents for hyperglycemia and increased small seizures.  Patient recently admitted for same, had EEG done.  Daughter reports patient checked glu at home and was 471.  Reports she had 2 bottles of water and some vinegar but was still high.    Medical/Surgical/Social history: as per HPI    Vitals: Wt 78 kg   BMI 33.59 kg/m     Pertinent brief exam:   Examination of area of concern: NAD, normocardic, resp even and unlabored.    Preliminary orders: labs, UA    Symptom based preliminary diagnosis/MDM: hyperglycemia    Patient advised to remain in the ED until further evaluation can be performed. Patient instructed to notify staff of any changes in condition while waiting.  This assessment is an initial evaluation to expedite care.

## 2022-08-20 MED ORDER — LORAZEPAM 0.5 MG PO TABS
0.5000 mg | ORAL_TABLET | Freq: Four times a day (QID) | ORAL | 0 refills | Status: AC | PRN
Start: 2022-08-19 — End: ?

## 2022-08-20 NOTE — ED Provider Notes (Signed)
 EMERGENCY DEPARTMENT HISTORY AND PHYSICAL EXAM      History of Presenting Illness:  History Provided By: Patient and Patient's Daughter    Tracey Sanchez is a 72 y.o. female pw "petit mal" seizures.  Patient reports episodes of seizures, described as shaking predominantly upper extremities with eyes rolling back.  Patient reports they occur when walks.  States that her momentary but frequent.  They do not occur when she is lying down.  Episodes started approx 2 months ago.     Recent hospitalization at [facility], was given Ativan which resolved episodes.  Transferred to [facility] for EEG monitoring.  Was here 2 days.  Did not receive Ativan but had no further episodes.  EEG did not show any epileptiform activity.  Daughter wonders if Ativan had suppressed the episodes.    History of tonic-clonic seizures, on Keppra and xcorpi.  Initially occurred over 10 years ago.  Has a few episodes per year.  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     Past Medical History:   Diagnosis Date    Chronic kidney disease     Convulsions     Diabetes mellitus     Hypertension     Lung disease     Sleep apnea       Social Determinants of Health     Tobacco Use: Low Risk  (08/20/2022)    Patient History     Smoking Tobacco Use: Never     Smokeless Tobacco Use: Never     Passive Exposure: Not on file   Alcohol Use: Not At Risk (08/09/2022)    AUDIT-C     Frequency of Alcohol Consumption: Monthly or less     Average Number of Drinks: 1 or 2     Frequency of Binge Drinking: Never   Financial Resource Strain: Medium Risk (08/09/2022)    Overall Financial Resource Strain (CARDIA)     Difficulty of Paying Living Expenses: Somewhat hard   Food Insecurity: Food Insecurity Present (08/16/2022)    Hunger Vital Sign     Worried About Running Out of Food in the Last Year: Never true     Ran Out of Food in the Last Year: Sometimes true   Transportation Needs: Unmet Transportation Needs (08/16/2022)    PRAPARE -  Transportation     Lack of Transportation (Medical): Yes     Lack of Transportation (Non-Medical): Yes   Physical Activity: Inactive (08/09/2022)    Exercise Vital Sign     Days of Exercise per Week: 0 days     Minutes of Exercise per Session: 0 min   Stress: Stress Concern Present (08/09/2022)    [employer] of Occupational Health - Occupational Stress Questionnaire     Feeling of Stress : Very much   Social Connections: Moderately Integrated (08/09/2022)    Social Connection and Isolation Panel [NHANES]     Frequency of Communication with Friends and Family: More than three times a week     Frequency of Social Gatherings with Friends and Family: Twice a week     Attends Religious Services: More than 4 times per year     Active Member of [employer] or Organizations: Yes     Attends Banker Meetings: 1 to 4 times per year     Marital Status: Widowed   Intimate Partner Violence: Not At Risk (08/16/2022)    Humiliation, Afraid, Rape, and Kick questionnaire     Fear  of Current or Ex-Partner: No     Emotionally Abused: No     Physically Abused: No     Sexually Abused: No   Depression: Not at risk (03/26/2022)    PHQ-2     PHQ-2 Score: 0   Housing Stability: Low Risk  (08/16/2022)    Housing Stability Vital Sign     Unable to Pay for Housing in the Last Year: No     Number of Places Lived in the Last Year: 1     Unstable Housing in the Last Year: No   Utilities: At Risk (08/16/2022)    [employer] Utilities     Threatened with loss of utilities: Yes   Reviewed and confirmed past medical, surgical, family and social history as documented.    PCP: Narda Rutherford, MD  SPECIALISTS:    Physical Exam:  Vitals:    08/19/22 2106 08/19/22 2230 08/19/22 2333 08/20/22 0001   BP: 179/85 185/72 (!) 212/88 (!) 207/86   Pulse: 83 72 69 69   Resp: 17 18  16    Temp: 98.2 F (36.8 C)   98.4 F (36.9 C)   TempSrc: Oral   Oral   SpO2: 99% 98%  99%   Weight:         Pulse Oximetry Interpretation: Normal     Physical Exam    Constitutional: Patient is alert.  Well-nourished.  NAD  Head: Atraumatic.   Eyes: EOMI. PERRL  ENT:  MMM.   Neck:  FROM. No spinal tenderness. Neck supple.    Cardiovascular: Normal rate and regular rhythm.   Pulmonary/Chest: Effort normal and breath sounds normal. No respiratory distress.   Abdominal: Soft. There is no tenderness. Bowel sounds present and normal.    Musculoskeletal:  No lower extremity edema or tenderness.    Neurological: Patient is alert and oriented to person, place, and time.  CN II-XII intact. Sensation intact throughout to light touch.  Power 5/5 all extremities. Pronator negative. Gait normal.   Skin: Skin is warm and dry.    Cardiac Monitor:  Rate: 60s  Rhythm:  Normal Sinus Rhythm     EKG:  Interpreted by the EP.   Time Interpreted: 2113   Rate: 80   Rhythm: Normal Sinus Rhythm    Interpretation: Lateral ST/TW changes.    Comparison:  No sig changes 08/17/2022    Labs:   Abnormal Labs Reviewed   CBC AND DIFFERENTIAL - Abnormal; Notable for the following components:       Result Value    Hgb 9.9 (*)     Hematocrit 33.1 (*)     MCH 24.6 (*)     MCHC 29.9 (*)     Lymphocytes Absolute Automated 3.39 (*)     All other components within normal limits   COMPREHENSIVE METABOLIC PANEL - Abnormal; Notable for the following components:    Glucose 257 (*)     BUN 27.0 (*)     Creatinine 1.5 (*)     Calcium 8.3 (*)     Albumin 3.4 (*)     Alkaline Phosphatase 139 (*)     Globulin 3.8 (*)     eGFR 37.4 (*)     All other components within normal limits   URINALYSIS REFLEX TO MICROSCOPIC EXAM - REFLEX TO CULTURE - Abnormal; Notable for the following components:    Protein, UR 10= Trace (*)     Glucose, UA 70= 1+ (*)     All other components  within normal limits   GLUCOSE WHOLE BLOOD - POCT - Abnormal; Notable for the following components:    Whole Blood Glucose POCT 244 (*)     All other components within normal limits   GLUCOSE WHOLE BLOOD - POCT - Abnormal; Notable for the following components:     Whole Blood Glucose POCT 226 (*)     All other components within normal limits   All labs have been personally reviewed.     Summary of Prior Notes (date, source and summary):  Previous Notes: Yes. Reviewed Neuro notes from 08/18/2022 that state 3 episodes while on EEG, no correlation with pushbutton events.  EEG with rare generalized spike and wave.  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  Continue Keppra, gabapentin and titrating up xcopri be  Previous labs or imaging review: Yes.   MRI C-Spine 08/18/2022 -degenerative changes of the intervertebral discs at each level, disc protrusions produce ventral spinal cord impingement at C3/4, C4/5, C5/6 and C6/7.    Patient Update Notes:       Medication given in the ED:    ED Medication Orders (From admission, onward)      Start Ordered     Status Ordering Provider    08/19/22 2329 08/19/22 2328  carvedilol (COREG) tablet 12.5 mg  Once        Route: Oral  Ordered Dose: 12.5 mg       Last MAR action: Given Ulice Bold M            Provider Notes: Seizure-like activity.  These episodes are different from patient's generalized seizures.  During hospitalization previously, events did not produce epileptiform activity on EEG.  Patient and family state improved with lorazepam during previous hospitalization, prior to transfer here.  Reasonable to use as needed benzo for either seizure or if this may be severe anxiety/PNES.  No deficits on examination.      Hyperglycemia, improved prior to arrival, may be contributing.  No infectious etiology identified.  Follow-up with neurology.    Elevated blood pressure reading, known hypertensive, did not take HTN medication today.    Considered (meds/labs/imaging/admission): Yes.  Considered hospitalization, however, patient was recently hospitalized for these same episodes.  Underwent continuous EEG and events did not correlate with findings on EEG.  Differential Diagnoses (included but not limited to): Focal seizures, PNES, hyperglycemia, dehydration,  electrolyte abnormality, and/or infection    Prescribed (and considered but not prescribed):   Discharge Medication List as of 08/20/2022 12:11 AM        START taking these medications    Details   LORazepam (ATIVAN) 0.5 MG tablet Take 1 tablet (0.5 mg) by mouth every 6 (six) hours as needed (seizure like activity), Starting Mon 08/19/2022, E-Rx             Clinical Impression:   1. Seizure-like activity    2. History of epilepsy    3. Hyperglycemia due to diabetes mellitus    4. Elevated blood pressure reading with diagnosis of hypertension        ED Disposition       ED Disposition   Discharge    Condition   --    Date/Time   Tue Aug 20, 2022 12:11 AM    Comment   Armanda Heritage discharge to home/self care.    Condition at disposition: Stable               CHART OWNERSHIP: Dr. Ulice Bold is the primary emergency physician of  record.    This note was generated by the Epic EMR system/ Dragon speech recognition and may contain inherent errors or omissions not intended by the user. Grammatical errors, random word insertions, deletions and pronoun errors  are occasional consequences of this technology due to software limitations. Not all errors are caught or corrected. If there are questions or concerns about the content of this note or information contained within the body of this dictation they should be addressed directly with the author for clarification     Tenny Craw, MD  08/22/22 [phone]

## 2022-08-21 LAB — ECG 12-LEAD
Atrial Rate: 80 {beats}/min
IHS MUSE NARRATIVE AND IMPRESSION: NORMAL
P Axis: 54 degrees
P-R Interval: 166 ms
Q-T Interval: 380 ms
QRS Duration: 78 ms
QTC Calculation (Bezet): 438 ms
R Axis: 14 degrees
T Axis: 99 degrees
Ventricular Rate: 80 {beats}/min

## 2022-08-26 NOTE — Provider Clarification Note (Signed)
Patient Name: Tracey Sanchez, [place]  Account #: [id number]   MR #: [id number]  Discharge Date: 08/18/2022    Dear Dr. Alfonse Spruce,    Inconsistent documentation has been found in the medical record.  As attending physician, clarification is requested.    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 Helane Rima and Seizure disorder was documented in Discharge Summary 02/18 by Dr. Alfonse Spruce    History/Risk Factors:  72 year old female with a PMHx of CKD 3, seizure disorder, HTN, DMII, HLD, on Keppra (long standing)+Xcopri (started 1 week ago), reports compliance, presented due to recurrent seizures for past several days described as hand twitching/eyes rolling back. - H&P 02/16 Dr. Enid Derry    Clinical Indicators:  > Seizure like activity, Chronic seizure disorder - H&P 02/16 Dr. Enid Derry  > Breakthrough seizure in the setting of Known seizure disorder - PN 02/17 Dr. Alfonse Spruce  > Reported myoclonic jerking - PN 02/18 Dr. Alfonse Spruce  > Pt has 3 episodes while on EEG. No EEG correlate on push button events. EEG w/ rare generalized spike and wave. 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 Pt endorses recent stress - PN 02/18 Dr. Helane Rima    Treatment:  EEG - 02/18 Dr. Wilder Glade  carBAMazepine (TEGretol XR) 12 hr tablet 200 mg - 02/16 MAR  levETIRAcetam (KEPPRA) tablet 1,500 mg - 02/16 MAR  POM-cenobamate (XCOPRI) tablet 25 mg - 02/17 MAR  gabapentin (NEURONTIN) capsule 600 mg - 02/16 [facility]  Neurology Consult - 02/17 Dr. 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

## 2022-09-03 ENCOUNTER — Encounter: Payer: Self-pay | Admitting: Student in an Organized Health Care Education/Training Program

## 2022-09-03 ENCOUNTER — Ambulatory Visit
Payer: Medicare Other | Attending: Student in an Organized Health Care Education/Training Program | Admitting: Student in an Organized Health Care Education/Training Program

## 2022-09-03 VITALS — BP 178/88 | HR 81 | Temp 98.8°F | Ht 60.0 in | Wt 180.0 lb

## 2022-09-03 DIAGNOSIS — F445 Conversion disorder with seizures or convulsions: Secondary | ICD-10-CM | POA: Insufficient documentation

## 2022-09-03 DIAGNOSIS — R569 Unspecified convulsions: Secondary | ICD-10-CM | POA: Insufficient documentation

## 2022-09-03 DIAGNOSIS — G4733 Obstructive sleep apnea (adult) (pediatric): Secondary | ICD-10-CM | POA: Insufficient documentation

## 2022-09-03 DIAGNOSIS — G40919 Epilepsy, unspecified, intractable, without status epilepticus: Secondary | ICD-10-CM | POA: Insufficient documentation

## 2022-09-03 NOTE — Patient Instructions (Signed)
-   Can try to get an appointment at [facility], [facility] for COGNITIVE BEHAVIORAL THERAPY as this is the gold standard of treatment for psychogenic nonepileptic events  - Phone: [phone]  - Address: [address], [place], [place]  - Dr. Natasha Mead has helped one of our patients before [phone] but it seems there are various different providers available to help. You can try to get an appointment with him however if he has openings.    - Start tapering off Xcopri slowly:  --> Decrease to '25mg'$  nightly x 1-2 week  --> Then, decrease to 12.'5mg'$  x 1-2 weeks  --> Then stop    - If we end up having concern for true epileptic events after you come off Xcopri, then we can instead start Zonisamide '100mg'$  nightly

## 2022-09-03 NOTE — Progress Notes (Unsigned)
[facility] - EPILEPSY FOLLOW UP VISIT     Patient Name: Tracey Sanchez  MRN: [id number]  Primary Care MD: Tracey Lan, MD Phone: [phone]    Date: 09/03/2022     Diagnosis: ***  Chief Complaint   Patient presents with    Seizures       Current ASMs:   LEV '1500mg'$  BID - (increased from '1000mg'$  BID to '1500mg'$  BID for 2 months)  Xcopri '50mg'$  nightly   Gabapentin 300/300/'600mg'$      Prior ASMs: PHB, VPA, PHT, LEV, CBZ - bad reaction, very sick/nauseous/vomiting/dizzy, PER  *not TPM or ZNS, CLB, LCM      Seizure History: Tracey Sanchez is a 72 y.o. female ***.      Interval History:    Tracey Sanchez to [facility] 2/16 and she got ativan oral which stopped the episodes. She went to [place] and did not have any episodes but then went she got home she had more episodes. Taken back to [place] and gave her more ativan.     She continues to have the episodes every day. More in the morning (Before they were 24/7) up until late afternoon and sometimes then they will slow down and at night it is not as bad.  Maybe around 30 per day still. Lasts maybe <1 minute.  She is feeling stuttering, b/l hand jerks, and can get off balance at times.     08/16/22 - 08/18/22 Tracey Sanchez:  "Tracey Sanchez is a 72 y.o. female with a PMHx of CKD 3, seizure disorder, HTN, DMII, HLD, on Keppra (long standing)+Xcopri (started 1 week ago), reports compliance, presented to [place] [facility] due to recurrent seizures for past several days described as hand twitching/eyes rolling back.  She reportedly had an episode that was witnessed in sending ER there and was given ativan. Was also reported to have EKG ST depression, Lead II and V6, TWI I, II, III, aVF (findings appearred new since last EKG 6/23), but patient denied any recent chest pain. Trops were 33 --> 30. Neurology at that facility discussed case with neuro here, Dr. Helane Sanchez, who agreed to consult and continuous EEG was recommended. Patient was  thus transferred here to [facility] and admitted to hospitalist service. Patient seen on arrival to the nursing unit. Her daughter is at bedside. Patient reports "little" seizures that occur multiple times per day. She says her eyes roll back and she has trouble speaking. She is fully conscious when these occur and they only last for a few seconds.   See HPI for details.     Hospital Course (2 Days)   Breakthrough seizure in the setting of   Known seizure disorder  Patient presenting with frequent daily seizures over the past 1 month.  Typical seizure described as eyes rolling back, stuttering speech, during which time patient is aware of seizure activity and is able to tell when an episode is coming on.  Presented to outside hospital for evaluation of seizure and transferred to [facility] for continuous EEG. She was started on continuous EEG without seizure activity. Neurology consulted during admission.   - Continue home [place], Xcopri, gabapentin  - Follow-up with epileptologist as scheduled"      NEUROLOGY NOTE 08/18/22:  HPI   Tracey Sanchez is a 72 y.o. female w/ hx of seizure disorder on Keppra and Xcopri, CKD, HTN, HLD, DM who was transferred from OSH for recurrent seizure like activity. She has had seizures since  age 6. Initially seizures were focal but over the past 5 years, pt has also had GTC's. Pt c/o episodes of eyes rolling back, speech changes (stuttering, difficulty getting words out) lasting <71mn. Pt is aware during these episodes. Pt was having multiple episodes per day over the past few days but they seemed to have slowed down after getting Ativan in the ED yesterday. She also has started to get whole body jerks causing her to drop objects. C/o b/l hand weakness.   Currently on Keppra 1.5g bid, titrating up on Xcopri and gabapentin '600mg'$  bid  Prior ASMs: PHB, VPA, PHT, LEV, CBZ - bad reaction, very sick/nauseous/vomiting/dizzy, PER  *not TPM or ZNS, CLB, LCM  Note 08/18/22:  Assessment:   Pt is a 89yoF w/ hx  of seizure disorder on Keppra and Xcopri, CKD, HTN, HLD, DM who was transferred from OSH for recurrent seizure like activity. Neuro exam w/ b/l UE 4+/5 and increased tone in b/l LE. 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      Pt has 3 episodes while on EEG. No EEG correlate on push button events. EEG w/ rare generalized spike and wave. 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 Pt endorses recent stress.      Plan:   -D/c cEEG  -Continue Keppra 1.5g bid  -Continue gabapentin '600mg'$  bid  -Continue titrting up on Xcopri (currently on '50mg'$  daily)  -Seizure precautions  -F/u w/ Dr. GGordy Levanas an outpatient     Discussed w/ pt. Discussed w/ Tracey Sanchez   08/19/22-08/20/22 -   "Tracey MAFIis a 72y.o. female pw "petit mal" seizures.  Patient reports episodes of seizures, described as shaking predominantly upper extremities with eyes rolling back.  Patient reports they occur when walks.  States that her momentary but frequent.  They do not occur when she is lying down.  Episodes started approx 2 months ago.   [facility] which [facility] episodes.  [facility]  Was [facility].  Did not receive Ativan but had no further episodes.  EEG did not show any epileptiform activity.  Daughter wonders if Ativan had suppressed the episodes.  History of tonic-clonic seizures, on Keppra and xcorpi.  Initially occurred over 10 years ago.  Has a few episodes per year.  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  Provider Notes: Seizure-like activity.  These episodes are different from patient's generalized seizures.  During hospitalization previously, events did not produce epileptiform activity on EEG.  Patient and family state improved with lorazepam during previous hospitalization, prior to transfer here.  Reasonable to use as needed  benzo for either seizure or if this may be severe anxiety/PNES.  No deficits on examination.    Hyperglycemia, improved prior to arrival, may be contributing.  No infectious etiology identified.  Follow-up with neurology.  Elevated blood pressure reading, known hypertensive, did not take HTN medication today.  Considered (meds/labs/imaging/admission): Yes.  Considered hospitalization, however, patient was recently hospitalized for these same episodes.  Underwent continuous EEG and events did not correlate with findings on EEG.  Differential Diagnoses (included but not limited to): Focal seizures, PNES, hyperglycemia, dehydration, electrolyte abnormality, and/or infection"      Seizure Type:  ?absence?  Desc: trouble speaking, stuttering, she may blank and stare; Per patient - with smaller seiuzres - eyes  roll back into her head, jering and suttering a lot, does not think she loses time, and then upper body can jerk, sometimse can lose a little strength in her legs. Hands can jerk.   Duration: 30 seconds  Frequency: Tuesday - bad one, Wednesday - one with some jerking; sometimes will go 1-2 weeks without any but then they will return; sometimes 40-60 per DAY     Seizure Type:  GTC  Desc: sometimes starts jerking more before LOC but sometimes it just happens suddenly; eyes rolling, convulsing, tongue bite; one time with urinary incontinence and once with bowel incontinence  Duration: 1-2 minutes  Frequency: 2 months ago; from 2019 until now she has had ~8 GTCs    Medications:  Current Outpatient Medications on File Prior to Visit   Medication Sig Dispense Refill    amLODIPine (NORVASC) 2.5 MG tablet Take 1 tablet (2.5 mg) by mouth daily      atorvastatin (LIPITOR) 80 MG tablet Take 1 tablet (80 mg) by mouth daily      carvedilol (COREG) 12.5 MG tablet Take 1 tablet (12.5 mg) by mouth 2 (two) times daily with meals      Cenobamate (Xcopri) 14 x 150 MG & 14 x200 MG Tablet Therapy Pack Take 1 tablet by mouth nightly 28  each 0    ezetimibe (ZETIA) 10 MG tablet Take 1 tablet (10 mg) by mouth daily      gabapentin (NEURONTIN) 300 MG capsule Take by mouth 3 (three) times daily One ( 300 mg ) tablet in the morning , one ( 300 mg ) tablet in the afternoon and two tablet (600 mg ) at bed time      Insulin Degludec (TRESIBA SC) Inject 25 Units into the skin daily      Insulin Zinc Human (NOVOLIN L SC) Inject 15 Unit into the skin PT injects 15 units every morning and 10 unit in the evening      levETIRAcetam (KEPPRA) 1000 MG tablet Take 1 tablet (1,000 mg) by mouth 3 (three) times daily      LORazepam (ATIVAN) 0.5 MG tablet Take 1 tablet (0.5 mg) by mouth every 6 (six) hours as needed (seizure like activity) 12 tablet 0    sodium bicarbonate 650 MG tablet Take 1 tablet (650 mg) by mouth 4 (four) times daily      [DISCONTINUED] aspirin 81 MG chewable tablet Chew 1 tablet (81 mg) by mouth daily (Patient not taking: Reported on 09/03/2022)       No current facility-administered medications on file prior to visit.       Side Effects:  Tiredness: {yes no:314532}  Unsteadiness: {yes no:314532}  Blurred Vision/Diplopia: {yes no:314532}  Nausea: {yes no:314532}  Tremor:  {yes no:314532}  Cognitive Problems: {yes no:314532}  Irritability/Mood swings: {yes no:314532}  Depression: {yes no:314532}    New Medical Problems or Risk Factors: {yes no:314532}    Past Medical History:   Diagnosis Date    Chronic kidney disease     Convulsions     Diabetes mellitus     Hypertension     Lung disease     Sleep apnea        Family History   Problem Relation Age of Onset    Heart disease Mother     Heart disease Father     Seizures Father     Seizures Sister     Breast cancer Maternal Aunt        Past Surgical History:  Procedure Laterality Date    CHOLECYSTECTOMY      KNEE ARTHROSCOPY Left     REVISION, CARPAL TUNNEL Left     SHOULDER SURGERY         Social History     Tobacco Use    Smoking status: Never    Smokeless tobacco: Never   Vaping Use    Vaping Use:  Never used   Substance Use Topics    Alcohol use: Yes     Comment: glass of wine occ    Drug use: Never       Other Social History:  ***    Allergies:   Allergies   Allergen Reactions    Dilantin [Phenytoin]     Codeine Other (See Comments) and Anxiety     Other reaction(s): Other (see comments), other/intolerance         REVIEW OF SYSTEMS:  Constitutional: Negative for fever.   Respiratory: Negative for shortness of breath.    Cardiovascular: Negative for chest pain.   Gastrointestinal: Negative for abdominal pain.   Neurological: Negative for visual changes, speech or language problems, facial droop, focal weakness, focal numbness, limb incoordination, gait ataxia, dysphagia, or dizziness.   Psychiatric/Behavioral: Negative for mood disturbance.   All other systems reviewed and are negative except pertinent positives as noted above in HPI.     PHYSICAL EXAM:  BP 178/88 (BP Site: Left arm, Patient Position: Sitting, Cuff Size: Large)   Pulse 81   Temp 98.8 F (37.1 C) (Oral)   Ht 1.524 m (5')   Wt 81.6 kg (180 lb)   SpO2 96%   BMI 35.15 kg/m   Gen:  Well-developed, well-nourished.  No acute distress.  Cooperative with exam.  HEENT:  Normocephalic, atraumatic. Trachea midline  Neck: Normal range of motion.   CV:  RRR.  S1/S2.    Lungs:  Normal effort.  Extrem:  No edema.  Skin:  No obvious rashes or lesions.   Psych: Mood and affect are appropriate.    Neuro Exam:  MENTAL STATUS:  Awake, alert, oriented.  Follows commands.  Speech is fluent, non dysarthric.  CRANIAL NERVES:    CN III, IV, VI - PERRL.  EOMI.  No nystagmus.   CN V - Intact facial sensation to light touch.  CN VII - Facial movements symmetric.   CN VIII - Hearing intact to conversational speech.   CN XII - Tongue protrusion midline and without fasciculations.   MOTOR:  Normal bulk and tone.  No pronator drift.  Strength is full (5/5) and symmetric  SENSATION:  Intact to light touch in upper and lower extremities bilaterally.   REFLEXES: 2+  throughout   COORDINATION: Finger nose finger without dysmetria.  GAIT: Normal gait      LAB & TEST RESULTS:    Results for orders placed or performed during the hospital encounter of 08/19/22   CBC and differential   Result Value Ref Range    WBC 6.94 3.10 - 9.50 x10 3/uL    Hgb 9.9 (L) 11.4 - 14.8 g/dL    Hematocrit 33.1 (L) 34.7 - 43.7 %    Platelets 225 142 - 346 x10 3/uL    RBC 4.03 3.90 - 5.10 x10 6/uL    MCV 82.1 78.0 - 96.0 fL    MCH 24.6 (L) 25.1 - 33.5 pg    MCHC 29.9 (L) 31.5 - 35.8 g/dL    RDW 14 11 - 15 %    MPV 10.8 8.9 -  12.5 fL    Instrument Absolute Neutrophil Count 2.85 1.10 - 6.33 x10 3/uL    Neutrophils 41.2 None %    Lymphocytes Automated 48.8 None %    Monocytes 7.9 None %    Eosinophils Automated 1.6 None %    Basophils Automated 0.4 None %    Immature Granulocytes 0.1 None %    Nucleated RBC 0.0 0.0 - 0.0 /100 WBC    Neutrophils Absolute 2.85 1.10 - 6.33 x10 3/uL    Lymphocytes Absolute Automated 3.39 (H) 0.42 - 3.22 x10 3/uL    Monocytes Absolute Automated 0.55 0.21 - 0.85 x10 3/uL    Eosinophils Absolute Automated 0.11 0.00 - 0.44 x10 3/uL    Basophils Absolute Automated 0.03 0.00 - 0.08 x10 3/uL    Immature Granulocytes Absolute 0.01 0.00 - 0.07 x10 3/uL    Absolute NRBC 0.00 0.00 - 0.00 x10 3/uL   Comprehensive metabolic panel   Result Value Ref Range    Glucose 257 (H) 70 - 100 mg/dL    BUN 27.0 (H) 7.0 - 21.0 mg/dL    Creatinine 1.5 (H) 0.4 - 1.0 mg/dL    Sodium 137 135 - 145 mEq/L    Potassium 5.0 3.5 - 5.3 mEq/L    Chloride 107 99 - 111 mEq/L    CO2 22 17 - 29 mEq/L    Calcium 8.3 (L) 8.5 - 10.5 mg/dL    Protein, Total 7.2 6.0 - 8.3 g/dL    Albumin 3.4 (L) 3.5 - 5.0 g/dL    AST (SGOT) 17 5 - 41 U/L    ALT 18 0 - 55 U/L    Alkaline Phosphatase 139 (H) 37 - 117 U/L    Bilirubin, Total 0.2 0.2 - 1.2 mg/dL    Globulin 3.8 (H) 2.0 - 3.6 g/dL    Albumin/Globulin Ratio 0.9 0.9 - 2.2    Anion Gap 8.0 5.0 - 15.0    eGFR 37.4 (A) >=60 mL/min/1.73 m2   Urinalysis Reflex to Microscopic Exam-  Reflex to Culture   Result Value Ref Range    Urine Type Urine, Clean Ca     Color, UA Colorless     Clarity, UA Clear Clear - Hazy    Specific Gravity UA 1.007 1.001 - 1.035    Urine pH 6.5 5.0 - 8.0    Leukocyte Esterase, UA Negative Negative    Nitrite, UA Negative Negative    Protein, UR 10= Trace (A) Negative    Glucose, UA 70= 1+ (A) Negative    Ketones UA Negative Negative    Urobilinogen, UA Normal 0.2 - 2.0 mg/dL    Bilirubin, UA Negative Negative    Blood, UA Negative Negative    RBC, UA 0-2 0 - 5 /hpf    WBC, UA 0-5 0 - 5 /hpf    Squamous Epithelial Cells, Urine 0-5 0 - 25 /hpf   Glucose Whole Blood - POCT   Result Value Ref Range    Whole Blood Glucose POCT 244 (H) 70 - 100 mg/dL   Glucose Whole Blood - POCT   Result Value Ref Range    Whole Blood Glucose POCT 226 (H) 70 - 100 mg/dL   ECG 12 lead   Result Value Ref Range    Ventricular Rate 80 BPM    Atrial Rate 80 BPM    P-R Interval 166 ms    QRS Duration 78 ms    Q-T Interval 380 ms    QTC Calculation (Bezet)  438 ms    P Axis 54 degrees    R Axis 14 degrees    T Axis 99 degrees    [facility] MUSE NARRATIVE AND IMPRESSION       NORMAL SINUS RHYTHM  LEFT VENTRICULAR HYPERTROPHY WITH REPOLARIZATION ABNORMALITY ( R in aVL , Sokolow-Lyon )  ABNORMAL ECG  WHEN COMPARED WITH ECG OF 17-Aug-2022 04:34,  NONSPECIFIC T WAVE ABNORMALITY, WORSE IN INFERIOR LEADS  Confirmed by BERGER MD, RACHEL L. ([id number]) on 08/21/2022 9:24:18 AM         IMAGES:   Independently reviewed:{Yes/No (Default):[phone]::&quot;No&quot;}    NEURODIAGNOSTIC LABORATORY  LONG TERM ELECTROENCEPHALOGRAM MONITORING WITH VIDEO        CVEEG   Date/Time:  [id number] 08/17/2022 -  0600  08/18/2022  Patient Name: Tracey Sanchez, Tracey Sanchez  DOB: [birth year]  [id number]  MRN: [id number]        IMPRESSION:  Normal awake and asleep background.  Rare generalized spike and wake interictal discharges.  No seizures.        "The following report will be updated at intervals. If there are any 'spells', please push the event button.For  stat results:  the tech should call the reading neurologist on cell ".        INDICATION:   A long-term video EEG monitoring was recommended in this 72 y.o. year-old patient  with h/o epilepsy with recurrent seizures to evaluate any underlying non-convulsive status or seizures.      CNS MEDS:  Gabapentin '600mg'$  q12h  LEV '1500mg'$  q12h  Xcopri '50mg'$  QD     DESCRIPTION:  The posterior dominant rhythm is '9Hz'$ . The background is continuous, reactive, symmetric and consists of 8-10 Hz alpha activity which attenuates with eye opening.     Rare generalized high voltage 2-3 Hz spike and wave discharges likely represent interictal discharges.      No seizures .     Focal abnormality:no  State changes:sleep II  Push button events: 1630. No sz  Breech: no  Artifact:emg  EKG: regular        Recording Technique / Montage :     This electroencephalogram was recorded using both referential and differential montages. Using a digital machine the 18 channel International 10-20 System of electrode placement was used     Digital Analysis Methodology: (Persyst software 14 )  is available for the study: No  ?      EEG snapshot        Interictal discharge             Feliberto Gottron, MD      NEURODIAGNOSTIC LABORATORY  LONG TERM ELECTROENCEPHALOGRAM MONITORING WITH VIDEO        CVEEG   Date/Time:   0600 - 1640 08/18/2022  Patient Name: Tracey Sanchez, Tracey Sanchez  DOB: 24-Aug-1950  [id number]  MRN: [id number]        IMPRESSION:  Normal awake and asleep background.  Rare generalized spike and wake interictal discharges.  No seizures.     This EEG is unchanged from prior day's record.        "The following report will be updated at intervals. If there are any 'spells', please push the event button.For stat results:  the tech should call the reading neurologist on cell ".        INDICATION:   A long-term video EEG monitoring was recommended in this 72 y.o. year-old patient  with h/o epilepsy with recurrent seizures to evaluate any underlying non-convulsive status  or seizures.  CNS MEDS:  Gabapentin '600mg'$  q12h  LEV '1500mg'$  q12h  Xcopri '50mg'$  QD     DESCRIPTION:  The posterior dominant rhythm is '9Hz'$ . The background is continuous, reactive, symmetric and consists of 8-10 Hz alpha activity which attenuates with eye opening.     Rare generalized high voltage 2-3 Hz spike and wave discharges likely represent interictal discharges.      No seizures .     Focal abnormality:no  State changes:sleep II  Push button events: No  Breech: no  Artifact:emg  EKG: regular        Recording Technique / Montage :     This electroencephalogram was recorded using both referential and differential montages. Using a digital machine the 18 channel International 10-20 System of electrode placement was used     Digital Analysis Methodology: (Persyst software 14 )  is available for the study: No  ?      EEG snapshot              Interictal discharge           Feliberto Gottron, MD    ASSESSMENT AND PLAN:  Tracey Sanchez is a 72 y.o. female ***        No diagnosis found.    - Can try to get an appointment at [facility], [facility] for [place] as this is the gold standard of treatment for psychogenic nonepileptic events  - Phone: [phone]  - Address: [address], [place], [place]  - Dr. Natasha Mead has helped one of our patients before [phone] but it seems there are various different providers available to help. You can try to get an appointment with him however if he has openings.    - Start tapering off Xcopri slowly:  --> Decrease to '25mg'$  nightly x 1-2 week  --> Then, decrease to 12.'5mg'$  x 1-2 weeks  --> Then stop    - If we end up having concern for true epileptic events after you come off Xcopri, then we can instead start Zonisamide '100mg'$  nightly    I have spent *** minutes reviewing the record, interviewing and examining the patient, and documenting the encounter.     Resa Miner, MD  [facility]  Board Certified in Adult Neurology, Epilepsy, and Clinical Neurophysiology by the ABPN

## 2022-09-05 DIAGNOSIS — F445 Conversion disorder with seizures or convulsions: Secondary | ICD-10-CM | POA: Insufficient documentation

## 2022-09-05 DIAGNOSIS — G40919 Epilepsy, unspecified, intractable, without status epilepticus: Secondary | ICD-10-CM | POA: Insufficient documentation

## 2022-10-08 ENCOUNTER — Encounter: Payer: Self-pay | Admitting: Student in an Organized Health Care Education/Training Program

## 2022-10-08 ENCOUNTER — Ambulatory Visit
Payer: Medicare Other | Attending: Student in an Organized Health Care Education/Training Program | Admitting: Student in an Organized Health Care Education/Training Program

## 2022-10-08 VITALS — BP 179/82 | HR 76 | Temp 97.7°F | Resp 16 | Ht 60.0 in | Wt 179.6 lb

## 2022-10-08 DIAGNOSIS — G40309 Generalized idiopathic epilepsy and epileptic syndromes, not intractable, without status epilepticus: Secondary | ICD-10-CM | POA: Insufficient documentation

## 2022-10-08 DIAGNOSIS — F445 Conversion disorder with seizures or convulsions: Secondary | ICD-10-CM | POA: Insufficient documentation

## 2022-10-08 MED ORDER — ZONISAMIDE 100 MG PO CAPS
100.0000 mg | ORAL_CAPSULE | Freq: Every day | ORAL | 3 refills | Status: AC
Start: 2022-10-08 — End: 2023-10-08

## 2022-10-08 NOTE — Patient Instructions (Signed)
-   Await phone call from Dr. Larena Sox Scott's office (Neuropsychologist who specializes in psychogenic nonepileptic events)    - If the episodes significantly worsen and you want to add on another seizure medication:  --> Start Zonisamide 100mg  (1 Tab) nightly
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
## 2022-10-09 ENCOUNTER — Other Ambulatory Visit: Payer: Self-pay | Admitting: Student in an Organized Health Care Education/Training Program

## 2022-10-09 DIAGNOSIS — F445 Conversion disorder with seizures or convulsions: Secondary | ICD-10-CM

## 2022-10-10 ENCOUNTER — Telehealth: Payer: Self-pay | Admitting: Clinical Neuropsychologist

## 2022-10-10 NOTE — Telephone Encounter (Signed)
LVM/MYCHART to return my call to [facility].

## 2022-10-25 ENCOUNTER — Telehealth: Payer: Self-pay | Admitting: Clinical Neuropsychologist

## 2022-10-25 NOTE — Telephone Encounter (Signed)
Lvm/mychart

## 2022-12-10 ENCOUNTER — Ambulatory Visit: Payer: Medicare Other | Admitting: Student in an Organized Health Care Education/Training Program
